# Patient Record
Sex: Female | Born: 1980 | Race: White | Hispanic: No | Smoking: Current every day smoker
Health system: Southern US, Community
[De-identification: ages and names within clinical notes are randomized; demographics above are authoritative.]

## PROBLEM LIST (undated history)

## (undated) DIAGNOSIS — M542 Cervicalgia: Secondary | ICD-10-CM

## (undated) DIAGNOSIS — G8929 Other chronic pain: Secondary | ICD-10-CM

## (undated) HISTORY — PX: TUBAL LIGATION: SHX77

## (undated) HISTORY — PX: CERVICAL SPINE SURGERY: SHX589

---

## 2011-03-31 ENCOUNTER — Emergency Department: Payer: Self-pay | Admitting: Emergency Medicine

## 2014-02-26 ENCOUNTER — Emergency Department (HOSPITAL_BASED_OUTPATIENT_CLINIC_OR_DEPARTMENT_OTHER)
Admission: EM | Admit: 2014-02-26 | Discharge: 2014-02-26 | Disposition: A | Discharge status: Home / Self Care | Payer: Worker's Compensation | Attending: Emergency Medicine | Admitting: Emergency Medicine

## 2014-02-26 ENCOUNTER — Emergency Department (HOSPITAL_BASED_OUTPATIENT_CLINIC_OR_DEPARTMENT_OTHER): Payer: Worker's Compensation

## 2014-02-26 ENCOUNTER — Encounter (HOSPITAL_BASED_OUTPATIENT_CLINIC_OR_DEPARTMENT_OTHER): Payer: Self-pay | Admitting: Emergency Medicine

## 2014-02-26 DIAGNOSIS — M542 Cervicalgia: Secondary | ICD-10-CM | POA: Insufficient documentation

## 2014-02-26 DIAGNOSIS — G8929 Other chronic pain: Secondary | ICD-10-CM

## 2014-02-26 DIAGNOSIS — Z79899 Other long term (current) drug therapy: Secondary | ICD-10-CM | POA: Insufficient documentation

## 2014-02-26 DIAGNOSIS — F17200 Nicotine dependence, unspecified, uncomplicated: Secondary | ICD-10-CM | POA: Insufficient documentation

## 2014-02-26 DIAGNOSIS — Z9889 Other specified postprocedural states: Secondary | ICD-10-CM | POA: Insufficient documentation

## 2014-02-26 DIAGNOSIS — R209 Unspecified disturbances of skin sensation: Secondary | ICD-10-CM | POA: Insufficient documentation

## 2014-02-26 DIAGNOSIS — G8928 Other chronic postprocedural pain: Secondary | ICD-10-CM | POA: Insufficient documentation

## 2014-02-26 MED ORDER — OXYCODONE-ACETAMINOPHEN 5-325 MG PO TABS
2.0000 | ORAL_TABLET | Freq: Once | ORAL | Status: AC
  Administered 2014-02-26: 2 via ORAL
  Filled 2014-02-26: qty 2

## 2014-02-26 MED ORDER — OXYCODONE-ACETAMINOPHEN 5-325 MG PO TABS: 1.0000 | ORAL_TABLET | ORAL | Status: DC | PRN

## 2014-02-26 NOTE — ED Notes (Signed)
Pt reports having C5-C6 repaired in December but 4 days ago started experiencing extreme neck pain.  Pt reports not being able to sleep.  Pt reports it hurts to chew food.  Denies injury and has not broke her 20lb weight limit restriction.

## 2014-02-26 NOTE — ED Provider Notes (Signed)
This chart was scribed for [name], DO by Dorothey Basemania Sutton, ED Scribe. This patient was seen in room MH10/MH10 and the patient's care was started at 8:52 PM.  CHIEF COMPLAINT: neck pain  HPI:  HPI Comments: Brittany Valdez is a 33 y.o. 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 Performed on 12/20/2013 by Dr. [place] BalesLacin in [place], [place] who presents to the Emergency Department complaining of a constant, severe pain to the posterior neck that she states is chronic in nature secondary to her surgery, but has been progressively worsening over the past 4 days. Patient reports some associated numbness and paresthesias to the bilateral arms, which she states has also been ongoing since her surgery. She denies any potential injury or trauma to the area. Patient states that she attempted to follow up with Dr. Jones BalesLacin, but that he reportedly told her that he would not be able to manage her pain. Patient states that she last received an MRI on 01/11/2014, which was normal. She denies bowel or bladder incontinence. No new weakness or numbness.  Patient states that she does not currently have a PCP. She denies any allergies to medications. Patient has no other pertinent medical history.   ROS: See HPI Constitutional: no fever  Eyes: no drainage  ENT: no runny nose   Cardiovascular:  no chest pain  Resp: no SOB  GI: no vomiting GU: no dysuria Integumentary: no rash  Allergy: no hives  Musculoskeletal: neck pain, no leg swelling  Neurological: numbness, paresthesias, no slurred speech, no incontinence ROS otherwise negative  PAST MEDICAL HISTORY/PAST SURGICAL HISTORY:  History reviewed. No pertinent past medical history.  MEDICATIONS:  Prior to Admission medications   Medication Sig Start Date End Date Taking? Authorizing Provider  Gabapentin (NEURONTIN PO)  Take by mouth.   Yes Historical Provider, MD  Ketorolac Tromethamine (TORADOL ORAL PO) Take by mouth.   Yes Historical Provider, MD  lidocaine (LIDODERM) 5 % Place 1 patch onto the skin daily. Remove & Discard patch within 12 hours or as directed by MD   Yes Historical Provider, MD  metaxalone (SKELAXIN) 800 MG tablet Take 800 mg by mouth 3 (three) times daily.   Yes Historical Provider, MD    ALLERGIES:  No Known Allergies  SOCIAL HISTORY:  History  Substance Use Topics  . Smoking status: Current Every Day Smoker -- 0.50 packs/day    Types: Cigarettes  . Smokeless tobacco: Not on file  . Alcohol Use: No    FAMILY HISTORY: No family history on file.  EXAM: Triage Vitals: BP 145/100  Pulse 96  Temp(Src) 98.2 F (36.8 C) (Oral)  Resp 20  Ht 5\' 7"  (1.702 m)  Wt 223 lb (101.152 kg)  BMI 34.92 kg/m2  SpO2 100%  LMP 02/14/2014  CONSTITUTIONAL: Alert and oriented and responds appropriately to questions. Well-appearing; well-nourished; patient is tearful and appears uncomfortable HEAD: Normocephalic EYES: Conjunctivae clear, PERRL ENT: normal nose; no rhinorrhea; moist mucous membranes; pharynx without lesions noted NECK: Supple, no meningismus, no LAD; some midline tenderness to palpation of the C spine  CARD: RRR; S1 and S2 appreciated; no murmurs, no clicks, no rubs, no gallops RESP: Normal chest excursion without splinting or tachypnea; breath sounds clear and equal bilaterally; no wheezes, no rhonchi, no rales,  ABD/GI: Normal bowel sounds; non-distended; soft, non-tender, no rebound, no guarding BACK:  The back appears  normal and is non-tender to palpation, there is no CVA tenderness EXT: Normal ROM in all joints; non-tender to palpation; no edema; normal capillary refill; no cyanosis    SKIN: Normal color for age and race; warm NEURO: Moves all extremities equally; 5/5 strength and normal sensation to light touch of bilateral upper and lower extremities; 2+ DTRs of  bilateral upper and lower extremities PSYCH: The patient's mood and manner are appropriate. Grooming and personal hygiene are appropriate.  MEDICAL DECISION MAKING: Patient presents with chronic neck pain after a C5-C6 surgery in December, 2014. Patient last had an MRI on 01/11/2014 and is not concerning for an emergent MRI at this time. Patient has no new neurologic deficits. She is nontoxic appearing. No infectious symptoms. I am not concerned for discitis, transverse myelitis, epidural abscess, spinal stenosis or other life-threatening injury or illness. Wll order a CT of the C spine due to midline tenderness upon examination. Advised patient to find a PCP to have an additional MRI if clinically appropriate at that time. Will order Percocet.  10:05 PM- Independently reviewed CT results, which were unchanged from prior examinations and negative for acute abnormality. Will provide patient with resources to help her find a PCP, neurologist, and pain management. Will discharge patient with pain medication. Return precautions given.   ED PROGRESS:  9:00 PM- Will order a CT of the C spine and Percocet. Discussed treatment plan with patient at bedside and patient verbalized agreement.   10:04 PM- Patient reports feeling better at recheck after receiving the Percocet. Discussed that CT results were normal. Will discharge patient with pain medication to manage symptoms. Discussed treatment plan with patient at bedside and patient verbalized agreement.    Ct Cervical Spine Wo Contrast  02/26/2014   CLINICAL DATA:  Neck pain. Bilateral arm numbness. C5-6 anterior discectomy on 12/19/2013  EXAM: CT CERVICAL SPINE WITHOUT CONTRAST  TECHNIQUE: Multidetector CT imaging of the cervical spine was performed without intravenous contrast. Multiplanar CT image reconstructions were also generated.  COMPARISON:  CT scan dated 02/08/2014  FINDINGS: The patient has undergone anterior cervical fusion at C5-6. The anterior  plate, screws, and interbody bone plug appear in excellent position. There is no prevertebral soft tissue swelling. There is no protrusion of material into the spinal canal at the operative level. There is no foraminal stenosis or spinal stenosis.  The remainder of the cervical spine demonstrates no acute abnormalities. There is degenerative disc disease at C6-7 with disc space narrowing and slight osteophyte formation with a small broad-based disc bulge with no neural impingement, unchanged.  IMPRESSION: No acute abnormalities.  No change since the prior exam.   Electronically Signed   By: Geanie Cooley M.D.   On: 02/26/2014 21:44     I personally performed the services described in this documentation, which was scribed in my presence. The recorded information has been reviewed and is accurate.   Layla Maw Makaiah Terwilliger, DO 02/26/14 2229

## 2014-02-26 NOTE — Discharge Instructions (Signed)
Chronic Back Pain ° When back pain lasts longer than 3 months, it is called chronic back pain. People with chronic back pain often go through certain periods that are more intense (flare-ups).  °CAUSES °Chronic back pain can be caused by wear and tear (degeneration) on different structures in your back. These structures include: °· The bones of your spine (vertebrae) and the joints surrounding your spinal cord and nerve roots (facets). °· The strong, fibrous tissues that connect your vertebrae (ligaments). °Degeneration of these structures may result in pressure on your nerves. This can lead to constant pain. °HOME CARE INSTRUCTIONS °· Avoid bending, heavy lifting, prolonged sitting, and activities which make the problem worse. °· Take brief periods of rest throughout the day to reduce your pain. Lying down or standing usually is better than sitting while you are resting. °· Take over-the-counter or prescription medicines only as directed by your caregiver. °SEEK IMMEDIATE MEDICAL CARE IF:  °· You have weakness or numbness in one of your legs or feet. °· You have trouble controlling your bladder or bowels. °· You have nausea, vomiting, abdominal pain, shortness of breath, or fainting. °Document Released: 01/14/2005 Document Revised: 02/29/2012 Document Reviewed: 11/21/2011 °ExitCare® Patient Information ©2014 [employer]. ° ° °Emergency Department Resource Guide °1) Find a Doctor and Pay Out of Pocket °Although you won't have to find out who is covered by your insurance plan, it is a good idea to ask around and get recommendations. You will then need to call the office and see if the doctor you have chosen will accept you as a new patient and what types of options they offer for patients who are self-pay. Some doctors offer discounts or will set up payment plans for their patients who do not have insurance, but you will need to ask so you aren't surprised when you get to your appointment. ° °2) Contact Your Local  Health Department °Not all health departments have doctors that can see patients for sick visits, but many do, so it is worth a call to see if yours does. If you don't know where your local health department is, you can check in your phone book. The CDC also has a tool to help you locate your state's health department, and many state websites also have listings of all of their local health departments. ° °3) Find a Walk-in Clinic °If your illness is not likely to be very severe or complicated, you may want to try a walk in clinic. These are popping up all over the country in pharmacies, drugstores, and shopping centers. They're usually staffed by nurse practitioners or physician assistants that have been trained to treat common illnesses and complaints. They're usually fairly quick and inexpensive. However, if you have serious medical issues or chronic medical problems, these are probably not your best option. ° °No Primary Care Doctor: °- Call [facility] at  832-8000 - they can help you locate a primary care doctor that  accepts your insurance, provides certain services, etc. °- Physician Referral Service- 1-800-533-3463 ° °Chronic Pain Problems: °Organization         Address  Phone   Notes  °[facility]  (336) 297-2271 Patients need to be referred by their primary care doctor.  ° °Medication Assistance: °Organization         Address  Phone   Notes  °[place] [facility] [address] °[place], [place] [address] (336) 641-8030 --Must be a resident of [place] °--   Must have NO insurance coverage whatsoever (no Medicaid/ Medicare, etc.) °-- The pt. MUST have a primary care doctor that directs their care regularly and follows them in the community °  °[facility]  (866) 331-1348   °[employer]  (888) 892-1162   ° °Agencies that provide inexpensive medical care: °Organization         Address  Phone   Notes  °[facility]  (336) 832-8035    °[facility]    (336) 832-7272   °[facility] [address] °[place], [place] [address] (336) 832-4777   °[facility] [address], °[place] (336) 271-4999   °[facility]    (336) 373-0678   °[facility]    (336) 272-1050   °[facility] ° [address], [place] Phone:  (336) 832-4444, Fax:  (336) 832-4440 Hours of Operation:  9 am - 6 pm, M-F.  Also accepts Medicaid/Medicare and self-pay.  °[facility] ° [address], [place] Phone: (336) 832-3150, Fax: (336) 832-3151. Hours of Operation:  8:30 am - 5:30 pm, M-F.  Also accepts Medicaid and self-pay.  °[facility] [place] [address], [place] Phone: (336) 878-6027   °[facility] [address], [place], [place] (336)723-1848, Ext. 123 Mondays & Thursdays: 7-9 AM.  First 15 patients are seen on a first come, first serve basis. °  ° °Medicaid-accepting [place] Providers: ° °Organization         Address  Phone   Notes  °[facility] [address], [place] (336) 641-2100 Also accepts self-pay patients.  °[facility] [address], [place] ° (336) 856-9996   °[facility] [address], [place] (336) 288-8857   °[facility] [address] [place] [address], [place] (336) 299-7000   °Veita Bland [address], [place]  ° (336) 373-1557 Only accepts [place] Access Medicaid patients after they have their name applied to their card.  ° °Self-Pay (no insurance) in [place]: ° °Organization         Address  Phone   Notes  °Sickle Cell Patients, [facility] [address], [place] (336) 832-1970   °[facility] [address], [place] (336) 832-4400   °[facility] ° [address],  [place] (336) 992-4800   °[facility]/Dr. Osei-Bonsu ° [address] [place] [address], [place] or [address], [place] (336) [phone] Phone number for both [place] and [place] locations is the same.  °[facility] [address], [place] (336) 299-0000   °[facility] [address] [place] [address], [place] or [address] (336) 852-7530 °(336) 878-2260   °[facility] [address],  (336) 350-1642, phone; (336) 294-5005, fax Sees patients 1st and 3rd Saturday of every month.  Must not qualify for public or private insurance (i.e. Medicaid, Medicare, [place] Health Choice, Veterans' Benefits) • Household income should be no more than 200% of the poverty level •The clinic cannot treat you if you are pregnant or think you are pregnant • Sexually transmitted diseases are not treated at the clinic.  ° ° °Dental Care: °Organization         Address  Phone  Notes  °  [place] [facility] [address], [place] (336) 641-6152 Accepts children up to age 21 who are enrolled in Medicaid or [place] Health Choice; pregnant women with a Medicaid card; and children who have applied for Medicaid or [place] Health Choice, but were declined, whose parents can pay a reduced fee at time of service.  °[facility] [place]  [address], [place] (336) 641-7733 Accepts children up to age 21 who are enrolled in Medicaid or [place] Health Choice; pregnant women with a Medicaid card; and children who have applied for Medicaid or [place] Health Choice, but were declined, whose parents can pay a reduced fee at time of service.  °[facility] ° [address], [place] (336) 641-4533 Patients are seen by appointment only. Walk-ins are not accepted. [facility] will see patients 18 years of age and older. °Monday - Tuesday (8am-5pm) °Most Wednesdays  (8:30-5pm) °$30 per visit, cash only  °[facility] ° [address], [place] (336) 641-4533 Patients are seen by appointment only. Walk-ins are not accepted. [facility] will see patients 18 years of age and older. °One Wednesday Evening (Monthly: Volunteer Based).  $30 per visit, cash only  °[facility]  (919) 537-3737 for adults; Children under age 4, call Graduate Pediatric Dentistry at ([phone]) [phone]. Children aged 4-14, please call (919) [phone] to request a pediatric application. ° Dental services are provided in all areas of dental care including fillings, crowns and bridges, complete and partial dentures, implants, gum treatment, root canals, and extractions. Preventive care is also provided. Treatment is provided to both adults and children. °Patients are selected via a lottery and there is often a waiting list. °  °[facility] [address], °[place] ° (336) 763-8833 [email or web address] °  °[facility] [address], [place], [place] (336)723-1848, Ext. 123 Second and Fourth Thursday of each month, opens at 6:30 AM; Clinic ends at 9 AM.  Patients are seen on a first-come first-served basis, and a limited number are seen during each clinic.  ° °[facility] ° [address], [place], [place] (336) 723-7904   Eligibility Requirements °You must have lived in [place], [place], or [place] counties for at least the last three months. °  You cannot be eligible for state or federal sponsored healthcare insurance, including Veterans Administration, Medicaid, or Medicare. °  You generally cannot be eligible for healthcare insurance through your employer.  °  How to apply: °Eligibility screenings are held every Tuesday and Wednesday afternoon from 1:00 pm until 4:00 pm. You do not need an appointment for the interview!  °[facility] [address], [place], [place] 336-631-2330   °[place]  [facility]  336-342-8273   °[facility]  336-703-3100   °[facility]  336-570-6415   ° °Behavioral Health Resources in the Community: °Intensive Outpatient Programs °Organization         Address  Phone  Notes  °[facility] [address], [place], [place] 336-878-6098   °[facility] [address], [place], [place] 336-832-9800   °[facility] [address], , [place] ° 336-882-2125   °[facility] [address],  °, [place] 1-800-853-5163 or 336-641-4981   °Substance Abuse Resources °Organization           Address  Phone  Notes  °[facility]  336-882-2125   °[facility]  336-784-9470   °[facility]  336-285-9073   °[facility]  336-845-3988   °Residential & Outpatient Substance Abuse Program  1-800-659-3381   °Psychological Services °Organization         Address  Phone  Notes  °[facility]  336- 832-9600   °[facility]  336- 378-7881   °[facility] [address], [place] 1-800-853-5163 or 336-641-4981   ° °Mobile Crisis Teams °Organization         Address  Phone  Notes  °[facility], Mobile Crisis Care Unit  1-877-626-1772   °Assertive °[facility] ° [address] [place], [place] 336-834-9664   °Sharon DeEsch [address] °[place] 336-554-5454   ° °Self-Help/Support Groups °Organization         Address  Phone             Notes  °[facility] [place] [facility] - variety of support groups  336- 373-1402 Call for more information  °Narcotics Anonymous (NA), [facility] [address], °[place]  2 meetings at this location  ° °Residential Treatment Programs °Organization         Address  Phone  Notes  °[facility] [address],    °[place]  1-866-801-8205   °[facility] ° [address], [place], [place]  704-293-8524   °[facility] [address], [place] 336-845-3988 Admissions: 8am-3pm M-F  °[facility] [address]    °[place], [place] 336-841-1104   °[facility] [address], [place], [place] 336-379-7146   °[facility] [address]  °, [place] 336-285-9073   °[facility] - Intensive Outpatient [address], , [place] 336-852-3033   °[facility] [address]  °[place], [place] 1-877-615-2722 or 336-784-9470   °[facility] [address] [place], [place] 336-227-7417 Accepts Medicaid  °[facility] [address]  ° [place] 1-800-659-3381 Substance Abuse/Addiction Treatment  ° °[place] Behavioral Health Resources °Organization         Address  Phone  Notes  °[facility]  (888) 581-9988   °Julie Brannon, PhD [address] [place], [place]   (336) 349-5553 or (336) 951-0000   °[facility]   [address] °[place], [place] (336) 349-4454   °[facility] [address], [place], [place] (336) 342-8316 Insurance/Medicaid/sponsorship through [facility]  °[facility] [address]                                    [place], [place] (336) 342-8316 Therapy/tele-psych/case  °[facility] [address]  ° [place], [place] (336) 349-2233    °Dr. Arfeen  (336) 349-4544   °[facility] [place]  [employer] [place] [facility] 1) [address], [place] °2) [address], [place] °3)  [address], [place] (336) 349-3220 °(336) 342-7768 ° °(336) 342-8140   °[place] [facility] (336) 342-1394 or (336) 342-3537 (After Hours)    ° ° ° °

## 2014-03-23 ENCOUNTER — Emergency Department (HOSPITAL_COMMUNITY): Payer: Worker's Compensation

## 2014-03-23 ENCOUNTER — Emergency Department (HOSPITAL_COMMUNITY)
Admission: EM | Admit: 2014-03-23 | Discharge: 2014-03-23 | Disposition: A | Discharge status: Home / Self Care | Payer: Worker's Compensation | Attending: Emergency Medicine | Admitting: Emergency Medicine

## 2014-03-23 ENCOUNTER — Encounter (HOSPITAL_COMMUNITY): Payer: Self-pay | Admitting: Emergency Medicine

## 2014-03-23 DIAGNOSIS — IMO0002 Reserved for concepts with insufficient information to code with codable children: Secondary | ICD-10-CM | POA: Insufficient documentation

## 2014-03-23 DIAGNOSIS — F411 Generalized anxiety disorder: Secondary | ICD-10-CM | POA: Insufficient documentation

## 2014-03-23 DIAGNOSIS — Z79899 Other long term (current) drug therapy: Secondary | ICD-10-CM | POA: Insufficient documentation

## 2014-03-23 DIAGNOSIS — Y9389 Activity, other specified: Secondary | ICD-10-CM | POA: Insufficient documentation

## 2014-03-23 DIAGNOSIS — Y9210 Unspecified residential institution as the place of occurrence of the external cause: Secondary | ICD-10-CM | POA: Insufficient documentation

## 2014-03-23 DIAGNOSIS — R42 Dizziness and giddiness: Secondary | ICD-10-CM | POA: Insufficient documentation

## 2014-03-23 DIAGNOSIS — X500XXA Overexertion from strenuous movement or load, initial encounter: Secondary | ICD-10-CM | POA: Insufficient documentation

## 2014-03-23 DIAGNOSIS — R55 Syncope and collapse: Secondary | ICD-10-CM | POA: Insufficient documentation

## 2014-03-23 DIAGNOSIS — Z9889 Other specified postprocedural states: Secondary | ICD-10-CM | POA: Insufficient documentation

## 2014-03-23 DIAGNOSIS — S161XXA Strain of muscle, fascia and tendon at neck level, initial encounter: Secondary | ICD-10-CM

## 2014-03-23 DIAGNOSIS — F17200 Nicotine dependence, unspecified, uncomplicated: Secondary | ICD-10-CM | POA: Insufficient documentation

## 2014-03-23 DIAGNOSIS — S139XXA Sprain of joints and ligaments of unspecified parts of neck, initial encounter: Secondary | ICD-10-CM | POA: Insufficient documentation

## 2014-03-23 LAB — CBC WITH DIFFERENTIAL/PLATELET
BASOS ABS: 0 10*3/uL (ref 0.0–0.1)
BASOS PCT: 0 % (ref 0–1)
EOS ABS: 0 10*3/uL (ref 0.0–0.7)
Eosinophils Relative: 0 % (ref 0–5)
HCT: 39.8 % (ref 36.0–46.0)
Hemoglobin: 13.6 g/dL (ref 12.0–15.0)
Lymphocytes Relative: 46 % (ref 12–46)
Lymphs Abs: 4.7 10*3/uL — ABNORMAL HIGH (ref 0.7–4.0)
MCH: 31 pg (ref 26.0–34.0)
MCHC: 34.2 g/dL (ref 30.0–36.0)
MCV: 90.7 fL (ref 78.0–100.0)
Monocytes Absolute: 0.6 10*3/uL (ref 0.1–1.0)
Monocytes Relative: 5 % (ref 3–12)
NEUTROS PCT: 48 % (ref 43–77)
Neutro Abs: 5 10*3/uL (ref 1.7–7.7)
PLATELETS: 333 10*3/uL (ref 150–400)
RBC: 4.39 MIL/uL (ref 3.87–5.11)
RDW: 13.3 % (ref 11.5–15.5)
WBC: 10.3 10*3/uL (ref 4.0–10.5)

## 2014-03-23 LAB — BASIC METABOLIC PANEL
BUN: 12 mg/dL (ref 6–23)
CALCIUM: 9.7 mg/dL (ref 8.4–10.5)
CO2: 25 mEq/L (ref 19–32)
Chloride: 103 mEq/L (ref 96–112)
Creatinine, Ser: 0.78 mg/dL (ref 0.50–1.10)
GFR calc Af Amer: >90 mL/min (ref >90)
GFR calc non Af Amer: >90 mL/min (ref >90)
GLUCOSE: 99 mg/dL (ref 70–99)
Potassium: 3.5 mEq/L — ABNORMAL LOW (ref 3.7–5.3)
SODIUM: 140 meq/L (ref 137–147)

## 2014-03-23 MED ORDER — IOHEXOL 350 MG/ML SOLN
100.0000 mL | Freq: Once | INTRAVENOUS | Status: AC | PRN
  Administered 2014-03-23: 100 mL via INTRAVENOUS

## 2014-03-23 MED ORDER — ONDANSETRON HCL 4 MG/2ML IJ SOLN
4.0000 mg | Freq: Once | INTRAMUSCULAR | Status: AC
  Administered 2014-03-23: 4 mg via INTRAVENOUS
  Filled 2014-03-23: qty 2

## 2014-03-23 MED ORDER — HYDROMORPHONE HCL PF 1 MG/ML IJ SOLN
1.0000 mg | Freq: Once | INTRAMUSCULAR | Status: AC
  Administered 2014-03-23: 1 mg via INTRAVENOUS
  Filled 2014-03-23: qty 1

## 2014-03-23 MED ORDER — OXYCODONE-ACETAMINOPHEN 5-325 MG PO TABS: 1.0000 | ORAL_TABLET | ORAL | Status: DC | PRN

## 2014-03-23 NOTE — ED Provider Notes (Signed)
CSN: [id number]     Arrival date & time 03/23/14  1354 History   First MD Initiated Contact with Patient 03/23/14 1410     Chief Complaint  Patient presents with  . Neck Pain     (Consider location/radiation/quality/duration/timing/severity/associated sxs/prior Treatment) HPI  This is a 33 y.o. 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 Performed on 12/20/2013 by Dr. Jones BalesLacin in [place], [place] who presents to the Emergency Department with neck pain and syncope. Patient reports that she was in physical therapy on Wednesday when she was doing some new flexion exercises. She flexes her neck forward and had onset of dizziness and presyncopal symptoms. She had to sit down. Patient states that she tried to get in to see her neurosurgeon yesterday but was unable to make the appointment. She reports a syncopal episode yesterday.  She denies any chest pain or shortness of breath. She reports 9/10 pain which is not improved with tramadol.   History reviewed. No pertinent past medical history. Past Surgical History  Procedure Laterality Date  . Cervical spine surgery    . Tubal ligation     History reviewed. No pertinent family history. History  Substance Use Topics  . Smoking status: Current Every Day Smoker -- 0.50 packs/day    Types: Cigarettes  . Smokeless tobacco: Not on file  . Alcohol Use: No   OB History   Grav Para Term Preterm Abortions TAB SAB Ect Mult Living                 Review of Systems  Constitutional: Negative for fever.  Respiratory: Negative for cough, chest tightness and shortness of breath.   Cardiovascular: Negative for chest pain.  Gastrointestinal: Negative for nausea, vomiting and abdominal pain.  Genitourinary: Negative for dysuria.  Musculoskeletal: Positive for neck pain. Negative for back pain.  Skin: Negative  for wound.  Neurological: Positive for dizziness, syncope and light-headedness. Negative for weakness, numbness and headaches.  Psychiatric/Behavioral: Negative for confusion.  All other systems reviewed and are negative.      Allergies  Review of patient's allergies indicates no known allergies.  Home Medications   Current Outpatient Rx  Name  Route  Sig  Dispense  Refill  . gabapentin (NEURONTIN) 300 MG capsule   Oral   Take 300 mg by mouth 3 (three) times daily.         Marland Kitchen. ibuprofen (ADVIL,MOTRIN) 200 MG tablet   Oral   Take 800 mg by mouth every 6 (six) hours as needed for moderate pain.         Marland Kitchen. ketorolac (TORADOL) 10 MG tablet   Oral   Take 10 mg by mouth every 6 (six) hours as needed for moderate pain.         Marland Kitchen. lidocaine (LIDODERM) 5 %   Transdermal   Place 1 patch onto the skin every 12 (twelve) hours. Remove & Discard patch within 12 hours or as directed by MD         . metaxalone (SKELAXIN) 800 MG tablet   Oral   Take 800 mg by mouth 3 (three) times daily as needed for muscle spasms.          . methylPREDNISolone (MEDROL) 4 MG tablet   Oral   Take 4-24 mg by mouth daily. She took for 6 days and started it on 03/17/14. She  took six tablets on day 1, five tablets on day 2, four tablets on day 3, three tablets on day 4, two tablets on day 5 and one tablet on day 6. She completed the regimen yesterday (03/22/14).         Marland Kitchen RASPBERRY KETONES PO   Oral   Take 2 tablets by mouth every morning.         . traMADol (ULTRAM) 50 MG tablet   Oral   Take 50 mg by mouth every 6 (six) hours as needed for moderate pain.          BP 132/90  Pulse 83  Temp(Src) 97.7 F (36.5 C) (Oral)  Resp 16  SpO2 100%  LMP 03/23/2014 Physical Exam  Nursing note and vitals reviewed. Constitutional: She is oriented to person, place, and time. She appears well-developed and well-nourished.  Anxious appearing  HENT:  Head: Normocephalic and atraumatic.  Mouth/Throat:  Oropharynx is clear and moist.  Eyes: Pupils are equal, round, and reactive to light.  Neck: Neck supple.  Limited range of motion secondary to pain, tenderness to palpation over the mid C-spine, no obvious deformity  Cardiovascular: Normal rate, regular rhythm and normal heart sounds.   No murmur heard. Pulmonary/Chest: Effort normal and breath sounds normal. No respiratory distress. She has no wheezes.  Abdominal: Soft. Bowel sounds are normal. There is no tenderness. There is no rebound.  Neurological: She is alert and oriented to person, place, and time.  Cranial nerves II through XII intact, 5 out of 5 strength in all 4 extremities, no clonus noted, no dysmetria  Skin: Skin is warm and dry.  Psychiatric: She has a normal mood and affect.    ED Course  Procedures (including critical care time) Labs Review Labs Reviewed  BASIC METABOLIC PANEL - Abnormal; Notable for the following:    Potassium 3.5 (*)    All other components within normal limits  CBC WITH DIFFERENTIAL - Abnormal; Notable for the following:    Lymphs Abs 4.7 (*)    All other components within normal limits   Imaging Review No results found.   EKG Interpretation   Date/Time:  Friday March 23 2014 14:50:55 EDT Ventricular Rate:  77 PR Interval:  130 QRS Duration: 108 QT Interval:  377 QTC Calculation: 427 R Axis:   64 Text Interpretation:  Sinus rhythm Low voltage, precordial leads Baseline  wander in lead(s) II V1 Confirmed by HORTON  MD, COURTNEY ([id number]) on  03/23/2014 3:16:06 PM      MDM   Final diagnoses:  None   Patient with cervical spine pain and syncope.  NOntoxic and nonfocal.  EKG and orthostatics within normal limits.  GIven recent neck manipulation and subsequent syncope, concern for dissection.  CTA pending.  Patient given pain medications.  Signed out to Dr. Micheline Maze.   Shon Baton, MD 03/23/14 [phone]

## 2014-03-23 NOTE — Discharge Instructions (Signed)
Cervical Sprain A cervical sprain is an injury in the neck in which the strong, fibrous tissues (ligaments) that connect your neck bones stretch or tear. Cervical sprains can range from mild to severe. Severe cervical sprains can cause the neck vertebrae to be unstable. This can lead to damage of the spinal cord and can result in serious nervous system problems. The amount of time it takes for a cervical sprain to get better depends on the cause and extent of the injury. Most cervical sprains heal in 1 to 3 weeks. CAUSES  Severe cervical sprains may be caused by:   Contact sport injuries (such as from football, rugby, wrestling, hockey, auto racing, gymnastics, diving, martial arts, or boxing).   Motor vehicle collisions.   Whiplash injuries. This is an injury from a sudden forward-and backward whipping movement of the head and neck.  Falls.  Mild cervical sprains may be caused by:   Being in an awkward position, such as while cradling a telephone between your ear and shoulder.   Sitting in a chair that does not offer proper support.   Working at a poorly designed computer station.   Looking up or down for long periods of time.  SYMPTOMS   Pain, soreness, stiffness, or a burning sensation in the front, back, or sides of the neck. This discomfort may develop immediately after the injury or slowly, 24 hours or more after the injury.   Pain or tenderness directly in the middle of the back of the neck.   Shoulder or upper back pain.   Limited ability to move the neck.   Headache.   Dizziness.   Weakness, numbness, or tingling in the hands or arms.   Muscle spasms.   Difficulty swallowing or chewing.   Tenderness and swelling of the neck.  DIAGNOSIS  Most of the time your health care provider can diagnose a cervical sprain by taking your history and doing a physical exam. Your health care provider will ask about previous neck injuries and any known neck  problems, such as arthritis in the neck. X-rays may be taken to find out if there are any other problems, such as with the bones of the neck. Other tests, such as a CT scan or MRI, may also be needed.  TREATMENT  Treatment depends on the severity of the cervical sprain. Mild sprains can be treated with rest, keeping the neck in place (immobilization), and pain medicines. Severe cervical sprains are immediately immobilized. Further treatment is done to help with pain, muscle spasms, and other symptoms and may include:  Medicines, such as pain relievers, numbing medicines, or muscle relaxants.   Physical therapy. This may involve stretching exercises, strengthening exercises, and posture training. Exercises and improved posture can help stabilize the neck, strengthen muscles, and help stop symptoms from returning.  HOME CARE INSTRUCTIONS   Put ice on the injured area.   Put ice in a plastic bag.   Place a towel between your skin and the bag.   Leave the ice on for 15 20 minutes, 3 4 times a day.   If your injury was severe, you may have been given a cervical collar to wear. A cervical collar is a two-piece collar designed to keep your neck from moving while it heals.  Do not remove the collar unless instructed by your health care provider.  If you have long hair, keep it outside of the collar.  Ask your health care provider before making any adjustments to your collar.   Minor adjustments may be required over time to improve comfort and reduce pressure on your chin or on the back of your head.  Ifyou are allowed to remove the collar for cleaning or bathing, follow your health care provider's instructions on how to do so safely.  Keep your collar clean by wiping it with mild soap and water and drying it completely. If the collar you have been given includes removable pads, remove them every 1 2 days and hand wash them with soap and water. Allow them to air dry. They should be completely  dry before you wear them in the collar.  If you are allowed to remove the collar for cleaning and bathing, wash and dry the skin of your neck. Check your skin for irritation or sores. If you see any, tell your health care provider.  Do not drive while wearing the collar.   Only take over-the-counter or prescription medicines for pain, discomfort, or fever as directed by your health care provider.   Keep all follow-up appointments as directed by your health care provider.   Keep all physical therapy appointments as directed by your health care provider.   Make any needed adjustments to your workstation to promote good posture.   Avoid positions and activities that make your symptoms worse.   Warm up and stretch before being active to help prevent problems.  SEEK MEDICAL CARE IF:   Your pain is not controlled with medicine.   You are unable to decrease your pain medicine over time as planned.   Your activity level is not improving as expected.  SEEK IMMEDIATE MEDICAL CARE IF:   You develop any bleeding.  You develop stomach upset.  You have signs of an allergic reaction to your medicine.   Your symptoms get worse.   You develop new, unexplained symptoms.   You have numbness, tingling, weakness, or paralysis in any part of your body.  MAKE SURE YOU:   Understand these instructions.  Will watch your condition.  Will get help right away if you are not doing well or get worse. Document Released: 10/04/2007 Document Revised: 09/27/2013 Document Reviewed: 06/14/2013 [employer] Patient Information 2014 [employer]  

## 2014-05-30 ENCOUNTER — Encounter (HOSPITAL_BASED_OUTPATIENT_CLINIC_OR_DEPARTMENT_OTHER): Payer: Self-pay | Admitting: Emergency Medicine

## 2014-05-30 ENCOUNTER — Emergency Department (HOSPITAL_BASED_OUTPATIENT_CLINIC_OR_DEPARTMENT_OTHER)
Admission: EM | Admit: 2014-05-30 | Discharge: 2014-05-30 | Disposition: A | Discharge status: Home / Self Care | Payer: Worker's Compensation | Attending: Emergency Medicine | Admitting: Emergency Medicine

## 2014-05-30 DIAGNOSIS — R5381 Other malaise: Secondary | ICD-10-CM | POA: Insufficient documentation

## 2014-05-30 DIAGNOSIS — Z9889 Other specified postprocedural states: Secondary | ICD-10-CM | POA: Insufficient documentation

## 2014-05-30 DIAGNOSIS — R209 Unspecified disturbances of skin sensation: Secondary | ICD-10-CM | POA: Insufficient documentation

## 2014-05-30 DIAGNOSIS — F17200 Nicotine dependence, unspecified, uncomplicated: Secondary | ICD-10-CM | POA: Insufficient documentation

## 2014-05-30 DIAGNOSIS — IMO0002 Reserved for concepts with insufficient information to code with codable children: Secondary | ICD-10-CM | POA: Insufficient documentation

## 2014-05-30 DIAGNOSIS — Z79899 Other long term (current) drug therapy: Secondary | ICD-10-CM | POA: Insufficient documentation

## 2014-05-30 DIAGNOSIS — M542 Cervicalgia: Secondary | ICD-10-CM

## 2014-05-30 DIAGNOSIS — R5383 Other fatigue: Secondary | ICD-10-CM

## 2014-05-30 MED ORDER — DIAZEPAM 5 MG PO TABS
5.0000 mg | ORAL_TABLET | Freq: Once | ORAL | Status: AC
  Administered 2014-05-30: 5 mg via ORAL
  Filled 2014-05-30: qty 1

## 2014-05-30 MED ORDER — OXYCODONE-ACETAMINOPHEN 5-325 MG PO TABS: 1.0000 | ORAL_TABLET | Freq: Four times a day (QID) | ORAL | Status: DC | PRN

## 2014-05-30 MED ORDER — HYDROMORPHONE HCL PF 1 MG/ML IJ SOLN
1.0000 mg | Freq: Once | INTRAMUSCULAR | Status: AC
  Administered 2014-05-30: 1 mg via INTRAMUSCULAR
  Filled 2014-05-30: qty 1

## 2014-05-30 NOTE — ED Notes (Signed)
Pt c/o neck/shoulder and arm pain x 3 days

## 2014-05-30 NOTE — Discharge Instructions (Signed)
Musculoskeletal Pain °Musculoskeletal pain is muscle and boney aches and pains. These pains can occur in any part of the body. Your caregiver may treat you without knowing the cause of the pain. They may treat you if blood or urine tests, X-rays, and other tests were normal.  °CAUSES °There is often not a definite cause or reason for these pains. These pains may be caused by a type of germ (virus). The discomfort may also come from overuse. Overuse includes working out too hard when your body is not fit. Boney aches also come from weather changes. Bone is sensitive to atmospheric pressure changes. °HOME CARE INSTRUCTIONS  °· Ask when your test results will be ready. Make sure you get your test results. °· Only take over-the-counter or prescription medicines for pain, discomfort, or fever as directed by your caregiver. If you were given medications for your condition, do not drive, operate machinery or power tools, or sign legal documents for 24 hours. Do not drink alcohol. Do not take sleeping pills or other medications that may interfere with treatment. °· Continue all activities unless the activities cause more pain. When the pain lessens, slowly resume normal activities. Gradually increase the intensity and duration of the activities or exercise. °· During periods of severe pain, bed rest may be helpful. Lay or sit in any position that is comfortable. °· Putting ice on the injured area. °· Put ice in a bag. °· Place a towel between your skin and the bag. °· Leave the ice on for 15 to 20 minutes, 3 to 4 times a day. °· Follow up with your caregiver for continued problems and no reason can be found for the pain. If the pain becomes worse or does not go away, it may be necessary to repeat tests or do additional testing. Your caregiver may need to look further for a possible cause. °SEEK IMMEDIATE MEDICAL CARE IF: °· You have pain that is getting worse and is not relieved by medications. °· You develop chest pain  that is associated with shortness or breath, sweating, feeling sick to your stomach (nauseous), or throw up (vomit). °· Your pain becomes localized to the abdomen. °· You develop any new symptoms that seem different or that concern you. °MAKE SURE YOU:  °· Understand these instructions. °· Will watch your condition. °· Will get help right away if you are not doing well or get worse. °Document Released: 12/07/2005 Document Revised: 02/29/2012 Document Reviewed: 08/11/2013 °ExitCare® Patient Information ©2014 [employer]. ° ° °Emergency Department Resource Guide °1) Find a Doctor and Pay Out of Pocket °Although you won't have to find out who is covered by your insurance plan, it is a good idea to ask around and get recommendations. You will then need to call the office and see if the doctor you have chosen will accept you as a new patient and what types of options they offer for patients who are self-pay. Some doctors offer discounts or will set up payment plans for their patients who do not have insurance, but you will need to ask so you aren't surprised when you get to your appointment. ° °2) Contact Your Local Health Department °Not all health departments have doctors that can see patients for sick visits, but many do, so it is worth a call to see if yours does. If you don't know where your local health department is, you can check in your phone book. The CDC also has a tool to help you locate your state's   health department, and many state websites also have listings of all of their local health departments. ° °3) Find a Walk-in Clinic °If your illness is not likely to be very severe or complicated, you may want to try a walk in clinic. These are popping up all over the country in pharmacies, drugstores, and shopping centers. They're usually staffed by nurse practitioners or physician assistants that have been trained to treat common illnesses and complaints. They're usually fairly quick and inexpensive. However,  if you have serious medical issues or chronic medical problems, these are probably not your best option. ° °No Primary Care Doctor: °- Call [facility] at  832-8000 - they can help you locate a primary care doctor that  accepts your insurance, provides certain services, etc. °- Physician Referral Service- 1-800-533-3463 ° °Chronic Pain Problems: °Organization         Address  Phone   Notes  °[facility]  (336) 297-2271 Patients need to be referred by their primary care doctor.  ° °Medication Assistance: °Organization         Address  Phone   Notes  °[place] [facility] [address] °[place], [place] [address] (336) 641-8030 --Must be a resident of [place] °-- Must have NO insurance coverage whatsoever (no Medicaid/ Medicare, etc.) °-- The pt. MUST have a primary care doctor that directs their care regularly and follows them in the community °  °[facility]  (866) 331-1348   °[employer]  (888) 892-1162   ° °Agencies that provide inexpensive medical care: °Organization         Address  Phone   Notes  °[facility]  (336) 832-8035   °[facility]    (336) 832-7272   °[facility] [address] °[place], [place] [address] (336) 832-4777   °[facility] [address], °[place] (336) 271-4999   °[facility]    (336) 373-0678   °[facility]    (336) 272-1050   °[facility] ° [address], [place] Phone:  (336) 832-4444, Fax:  (336) 832-4440 Hours of Operation:  9 am - 6 pm, M-F.  Also accepts Medicaid/Medicare and self-pay.  °[facility] ° [address], [place] Phone: (336) 832-3150, Fax: (336) 832-3151. Hours of Operation:  8:30 am - 5:30 pm, M-F.  Also accepts Medicaid and self-pay.  °[facility] [place] [address], [place] Phone: (336) 878-6027   °[facility] [address], [place], [place] (336)723-1848, Ext. 123 Mondays & Thursdays: 7-9 AM.  First 15 patients are seen on a first come, first serve basis. °  ° °Medicaid-accepting [place] Providers: ° °Organization         Address  Phone   Notes  °[facility] [address], [place] (336) 641-2100 Also accepts self-pay patients.  °[facility] [address], [place] ° (336) 856-9996   °[facility] [address], [place] (336) 288-8857   °[facility] [address] [place] [address], [place] (336) 299-7000   °Veita Bland [address], [place]  ° (336) 373-1557 Only accepts [place] Access Medicaid patients after they have their name applied to their card.  ° °Self-Pay (no insurance) in [place]: ° °Organization           Address  Phone   Notes  °Sickle Cell Patients, [facility] [address], [place] (336) 832-1970   °[facility] [address], [place] (336) 832-4400   °[facility] ° [address], [place] (336) 992-4800   °[facility]/Dr. Osei-Bonsu ° [address] [place] [address], [place] or [address], [place] (336) 841-8500 Phone number for both [place] and [place] locations is the same.  °[facility] [address], [place] (336) 299-0000   °[facility] [address] [place] [address], [place] or [address] (336) 852-7530 °(336) 878-2260   °[facility] [address], [place] (336) 350-1642, phone; (336) 294-5005, fax Sees patients 1st and 3rd Saturday of every month.  Must not qualify for public or private insurance (i.e. Medicaid, Medicare, [place] Health Choice, Veterans' Benefits) • Household income should be no more than 200% of the poverty level •The clinic cannot treat you if you are pregnant or think you are  pregnant • Sexually transmitted diseases are not treated at the clinic.  ° ° °Dental Care: °Organization         Address  Phone  Notes  °[facility] [address], [place] (336) 641-6152 Accepts children up to age 21 who are enrolled in Medicaid or [place] Health Choice; pregnant women with a Medicaid card; and children who have applied for Medicaid or [place] Health Choice, but were declined, whose parents can pay a reduced fee at time of service.  °[facility] [place]  [address], [place] (336) 641-7733 Accepts children up to age 21 who are enrolled in Medicaid or [place] Health Choice; pregnant women with a Medicaid card; and children who have applied for Medicaid or [place] Health Choice, but were declined, whose parents can pay a reduced fee at time of service.  °[facility] ° [address], [place] (336) 641-4533 Patients are seen by appointment only. Walk-ins are not accepted. [facility] will see patients 18 years of age and older. °Monday - Tuesday (8am-5pm) °Most Wednesdays (8:30-5pm) °$30 per visit, cash only  °[facility] ° [address], [place] (336) 641-4533 Patients are seen by appointment only. Walk-ins are not accepted. [facility] will see patients 18 years of age and older. °One Wednesday Evening (Monthly: Volunteer Based).  $30 per visit, cash only  °[facility]  (919) 537-3737 for adults; Children under age 4, call Graduate Pediatric Dentistry at (919) [phone]. Children aged 4-14, please call (919) [phone] to request a pediatric application. ° Dental services are provided in all areas of dental care including fillings, crowns and bridges, complete and partial dentures, implants, gum treatment, root canals, and extractions. Preventive care is also provided. Treatment is provided to both adults and  children. °Patients are selected via a lottery and there is often a waiting list. °  °[facility] [address], ° ° (336) 763-8833 [email or web address] °  °[facility] [address], [place], [place] (336)723-1848, Ext. 123 Second and Fourth Thursday of each month, opens at 6:30 AM; Clinic ends at 9 AM.  Patients are seen on a first-come first-served basis, and a limited number are seen during each clinic.  ° °[facility] ° [address], [place], [place] (336) 723-7904   Eligibility Requirements °You must have lived in [place], [place], or [place] counties for at least the last three months. °  You cannot be eligible for state or federal sponsored healthcare insurance, including Veterans Administration, Medicaid, or Medicare. °  You generally cannot be eligible for healthcare insurance through your employer.  °  How to apply: °Eligibility screenings are held every Tuesday and Wednesday afternoon from 1:00 pm until 4:00 pm. You do not need an appointment for the interview!  °[facility] [address], [place], [place] 336-631-2330   °[facility]  336-342-8273   °[facility]  336-703-3100   °[facility]  336-570-6415   ° °Behavioral Health Resources in the Community: °Intensive Outpatient Programs °Organization         Address  Phone  Notes  °[facility] [address], [place], [place] 336-878-6098   °[facility] [address], [place], [place] 336-832-9800   °[facility] [address], [place], [place] ° 336-882-2125   °[facility] [address],  °[place], [place] 1-800-853-5163 or 336-641-4981   °Substance Abuse Resources °Organization         Address  Phone  Notes  °[facility]  336-882-2125   °[facility]  336-784-9470   °[facility]  336-285-9073    °[facility]  336-845-3988   °Residential & Outpatient Substance Abuse Program  1-800-659-3381   °Psychological Services °Organization         Address  Phone  Notes  °[facility]  336- 832-9600   °[facility]  336- 378-7881   °[facility] [address], [place] 1-800-853-5163 or 336-641-4981   ° °Mobile Crisis Teams °Organization         Address  Phone  Notes  °[facility], Mobile Crisis Care Unit  1-877-626-1772   °Assertive °[facility] ° [address] [place], [place] 336-834-9664   °Sharon DeEsch [address] °[place] 336-554-5454   ° °Self-Help/Support Groups °Organization         Address  Phone             Notes  °[facility] - variety of support groups  336- 373-1402 Call for more information  °Narcotics Anonymous (NA), [facility] [address], °[place]  2 meetings at this location  ° °Residential Treatment Programs °Organization         Address  Phone  Notes  °[facility] [address],    °[place]  1-866-801-8205   °[facility] ° [address], [place], [place] 704-293-8524   °[facility] [address], [place] 336-845-3988 Admissions: 8am-3pm M-F  °[facility] [address]    °[place], [place] 336-841-1104   °[facility] [address], [place], [place] 336-379-7146   °[facility] [address]  °[place], [place] 336-285-9073   °[facility] - Intensive Outpatient [address], [place], [place] 336-852-3033   °[facility] [address]  °[place], [place] 1-877-615-2722 or 336-784-9470   °[facility] [address] [place], [place] 336-227-7417 Accepts Medicaid  °[facility] [address]  °  1-800-659-3381 Substance Abuse/Addiction Treatment  ° °[place]  Behavioral Health Resources °Organization           Address  Phone  Notes  °[facility]  (888) 581-9988   °Julie Brannon, PhD [address], [place]   (336) 349-5553 or (336) 951-0000   °[facility]   [address] °[place], [place] (336) 349-4454   °[facility] [address], [place], [place] (336) 342-8316 Insurance/Medicaid/sponsorship through [facility]  °[facility] [address]                                    [place], [place] (336) 342-8316 Therapy/tele-psych/case  °[facility] [address]  ° [place], [place] (336) 349-2233    °Dr. Arfeen  (336) 349-4544   °[facility] [place]  [employer] [place] [facility] 1) [address], [place] °2) [address], [place] °3)  [address], [place] (336) 349-3220 °(336) 342-7768 ° °(336) 342-8140   °[place] [facility] (336) 342-1394 or (336) 342-3537 (After Hours)    ° ° ° °

## 2014-05-30 NOTE — ED Notes (Signed)
C/o chronic neck pain, rt facial pain,  Increased last 3 days with numbness to left arm,  Unable to lift left arm and swelling bilateral arm and hand swelling  Pos radial pulse

## 2014-05-30 NOTE — ED Provider Notes (Signed)
CSN: [id number]     Arrival date & time 05/30/14  1925 History  This chart was scribed for Dagmar HaitWilliam Clover Feehan, MD by Evon Slackerrance Branch, ED Scribe. This patient was seen in room MH02/MH02 and the patient's care was started at 7:58 PM.    Chief Complaint  Patient presents with  . Neck Pain   Patient is a 33 y.o. female presenting with neck pain. The history is provided by the patient. No language interpreter was used.  Neck Pain Pain radiates to:  Does not radiate Pain severity:  Moderate Onset quality:  Gradual Duration:  3 days Timing:  Constant Progression:  Worsening Relieved by:  Nothing Exacerbated by: lying down. Associated symptoms: numbness and weakness   Associated symptoms: no fever    HPI Comments: Roberto ScalesMelissa Hoch is a 33 y.o. female who presents to the Emergency Department complaining of neck pain. She states she has a h/o cervical neck surgery and had her C5 and C6 replaced and fused. She states that she has been taking her tramadol for pain with no relief to her symptoms. She states she has not been taking her Skelaxin as prescribed. She states she has associated tingling in her arm and swelling in her arms onset 3 days. She states that she hasn't had swelling her arms since prior to her neck surgery. She states she is not able to lift more than 10lbs. She states that the pain worsens when she lays down. Denies fever or other symptoms.   History reviewed. No pertinent past medical history. Past Surgical History  Procedure Laterality Date  . Cervical spine surgery    . Tubal ligation     History reviewed. No pertinent family history. History  Substance Use Topics  . Smoking status: Current Every Day Smoker -- 0.50 packs/day    Types: Cigarettes  . Smokeless tobacco: Not on file  . Alcohol Use: No   OB History   Grav Para Term Preterm Abortions TAB SAB Ect Mult Living                 Review of Systems  Constitutional: Negative for fever.  Musculoskeletal:  Positive for neck pain.  Neurological: Positive for weakness and numbness.  All other systems reviewed and are negative.     Allergies  Review of patient's allergies indicates no known allergies.  Home Medications   Prior to Admission medications   Medication Sig Start Date End Date Taking? Authorizing Provider  gabapentin (NEURONTIN) 300 MG capsule Take 300 mg by mouth 3 (three) times daily.    Historical Provider, MD  ibuprofen (ADVIL,MOTRIN) 200 MG tablet Take 800 mg by mouth every 6 (six) hours as needed for moderate pain.    Historical Provider, MD  ketorolac (TORADOL) 10 MG tablet Take 10 mg by mouth every 6 (six) hours as needed for moderate pain.    Historical Provider, MD  lidocaine (LIDODERM) 5 % Place 1 patch onto the skin every 12 (twelve) hours. Remove & Discard patch within 12 hours or as directed by MD    Historical Provider, MD  metaxalone (SKELAXIN) 800 MG tablet Take 800 mg by mouth 3 (three) times daily as needed for muscle spasms.     Historical Provider, MD  methylPREDNISolone (MEDROL) 4 MG tablet Take 4-24 mg by mouth daily. She took for 6 days and started it on 03/17/14. She took six tablets on day 1, five tablets on day 2, four tablets on day 3, three tablets on day 4, two tablets  on day 5 and one tablet on day 6. She completed the regimen yesterday (03/22/14).    Historical Provider, MD  oxyCODONE-acetaminophen (PERCOCET) 5-325 MG per tablet Take 1 tablet by mouth every 4 (four) hours as needed. 03/23/14   Shanna Cisco, MD  RASPBERRY KETONES PO Take 2 tablets by mouth every morning.    Historical Provider, MD  traMADol (ULTRAM) 50 MG tablet Take 50 mg by mouth every 6 (six) hours as needed for moderate pain.    Historical Provider, MD   Triage Vitals: BP 146/87  Pulse 96  Temp(Src) 98.2 F (36.8 C) (Oral)  Resp 20  SpO2 99%  LMP 05/21/2014  Physical Exam  Nursing note and vitals reviewed. Constitutional: She is oriented to person, place, and time. She appears  well-developed and well-nourished. No distress.  HENT:  Head: Normocephalic and atraumatic.  Mouth/Throat: Oropharynx is clear and moist. No oropharyngeal exudate.  Eyes: Conjunctivae and EOM are normal.  Neck: Normal range of motion. No tracheal deviation present.  Cardiovascular: Normal rate.   Pulmonary/Chest: Effort normal. No respiratory distress.  Abdominal: Soft. She exhibits no distension and no mass. There is no tenderness. There is no rebound and no guarding.  Musculoskeletal: She exhibits no edema (no appreciable edema of hands).       Cervical back: She exhibits decreased range of motion and tenderness. She exhibits no bony tenderness, no swelling and no edema.  Diffuse musculature tenderness  Neurological: She is alert and oriented to person, place, and time.  Skin: Skin is warm and dry.  Psychiatric: She has a normal mood and affect. Her behavior is normal.    ED Course  Procedures (including critical care time) DIAGNOSTIC STUDIES: Oxygen Saturation is 99% on RA, normal by my interpretation.    COORDINATION OF CARE: 8:10 PM-Discussed treatment plan which includes pain medication with pt at bedside and pt agreed to plan.     Labs Review Labs Reviewed - No data to display  Imaging Review No results found.   EKG Interpretation None      MDM   Final diagnoses:  Neck pain   33 year old female presents with neck pain. Prior C5 and C6 fusion surgery. Has been having pain persistently since the surgery, but occasionally has flareups. She is on tramadol for maintenance pain medication. No injury, but pain worsening for past 2 weeks. She states some intermittent tingling and paresthesias in her arms. She states they feel weak and she cannot move them. I suspect most of her symptoms in her arms are due to muscle spasm in her neck. She has severe spasm in her neck with pain. No bony tenderness. She is neurovascular intact otherwise. Patient given pain medicine and muscle  relaxants.  Of note, she stated to me she was having swelling in her hands, I cannot appreciate any hand swelling. There is no pitting edema. I suspect since she is not moving her arms up high, she could be having some fullness sensation due to not moving her arms due to her neck pain. On reexam, strength exam is improved since the pain is decreased. She feels much more comfortable. She is stable for discharge. Instructed to follow with her PCP.     I personally performed the services described in this documentation, which was scribed in my presence. The recorded information has been reviewed and is accurate.     Dagmar Hait, MD 05/30/14 [phone]

## 2014-10-09 ENCOUNTER — Encounter (HOSPITAL_BASED_OUTPATIENT_CLINIC_OR_DEPARTMENT_OTHER): Payer: Self-pay | Admitting: Emergency Medicine

## 2014-10-09 ENCOUNTER — Emergency Department (HOSPITAL_BASED_OUTPATIENT_CLINIC_OR_DEPARTMENT_OTHER): Payer: Self-pay

## 2014-10-09 ENCOUNTER — Emergency Department (HOSPITAL_BASED_OUTPATIENT_CLINIC_OR_DEPARTMENT_OTHER)
Admission: EM | Admit: 2014-10-09 | Discharge: 2014-10-09 | Disposition: A | Discharge status: Home / Self Care | Payer: Self-pay | Attending: Emergency Medicine | Admitting: Emergency Medicine

## 2014-10-09 DIAGNOSIS — R55 Syncope and collapse: Secondary | ICD-10-CM | POA: Insufficient documentation

## 2014-10-09 DIAGNOSIS — S199XXA Unspecified injury of neck, initial encounter: Secondary | ICD-10-CM | POA: Insufficient documentation

## 2014-10-09 DIAGNOSIS — R51 Headache: Secondary | ICD-10-CM

## 2014-10-09 DIAGNOSIS — W06XXXA Fall from bed, initial encounter: Secondary | ICD-10-CM | POA: Insufficient documentation

## 2014-10-09 DIAGNOSIS — R519 Headache, unspecified: Secondary | ICD-10-CM

## 2014-10-09 DIAGNOSIS — W19XXXA Unspecified fall, initial encounter: Secondary | ICD-10-CM

## 2014-10-09 DIAGNOSIS — Z981 Arthrodesis status: Secondary | ICD-10-CM | POA: Insufficient documentation

## 2014-10-09 DIAGNOSIS — G8929 Other chronic pain: Secondary | ICD-10-CM | POA: Insufficient documentation

## 2014-10-09 DIAGNOSIS — Z79899 Other long term (current) drug therapy: Secondary | ICD-10-CM | POA: Insufficient documentation

## 2014-10-09 DIAGNOSIS — Y9289 Other specified places as the place of occurrence of the external cause: Secondary | ICD-10-CM | POA: Insufficient documentation

## 2014-10-09 DIAGNOSIS — Y9389 Activity, other specified: Secondary | ICD-10-CM | POA: Insufficient documentation

## 2014-10-09 DIAGNOSIS — Z720 Tobacco use: Secondary | ICD-10-CM | POA: Insufficient documentation

## 2014-10-09 DIAGNOSIS — S0990XA Unspecified injury of head, initial encounter: Secondary | ICD-10-CM | POA: Insufficient documentation

## 2014-10-09 HISTORY — DX: Cervicalgia: M54.2

## 2014-10-09 HISTORY — DX: Other chronic pain: G89.29

## 2014-10-09 LAB — CBC WITH DIFFERENTIAL/PLATELET
Basophils Absolute: 0 10*3/uL (ref 0.0–0.1)
Basophils Relative: 0 % (ref 0–1)
Eosinophils Absolute: 0.1 10*3/uL (ref 0.0–0.7)
Eosinophils Relative: 1 % (ref 0–5)
HEMATOCRIT: 38.6 % (ref 36.0–46.0)
Hemoglobin: 13 g/dL (ref 12.0–15.0)
LYMPHS PCT: 31 % (ref 12–46)
Lymphs Abs: 3.8 10*3/uL (ref 0.7–4.0)
MCH: 30.4 pg (ref 26.0–34.0)
MCHC: 33.7 g/dL (ref 30.0–36.0)
MCV: 90.4 fL (ref 78.0–100.0)
MONO ABS: 0.7 10*3/uL (ref 0.1–1.0)
MONOS PCT: 6 % (ref 3–12)
Neutro Abs: 7.8 10*3/uL — ABNORMAL HIGH (ref 1.7–7.7)
Neutrophils Relative %: 62 % (ref 43–77)
Platelets: 298 10*3/uL (ref 150–400)
RBC: 4.27 MIL/uL (ref 3.87–5.11)
RDW: 13.8 % (ref 11.5–15.5)
WBC: 12.4 10*3/uL — AB (ref 4.0–10.5)

## 2014-10-09 LAB — BASIC METABOLIC PANEL
Anion gap: 12 (ref 5–15)
BUN: 8 mg/dL (ref 6–23)
CALCIUM: 8.6 mg/dL (ref 8.4–10.5)
CO2: 24 meq/L (ref 19–32)
CREATININE: 0.6 mg/dL (ref 0.50–1.10)
Chloride: 106 mEq/L (ref 96–112)
GFR calc Af Amer: >90 mL/min (ref >90)
GFR calc non Af Amer: >90 mL/min (ref >90)
Glucose, Bld: 88 mg/dL (ref 70–99)
Potassium: 3.8 mEq/L (ref 3.7–5.3)
Sodium: 142 mEq/L (ref 137–147)

## 2014-10-09 MED ORDER — ONDANSETRON HCL 4 MG/2ML IJ SOLN
4.0000 mg | Freq: Once | INTRAMUSCULAR | Status: AC
  Administered 2014-10-09: 4 mg via INTRAVENOUS
  Filled 2014-10-09: qty 2

## 2014-10-09 MED ORDER — SODIUM CHLORIDE 0.9 % IV BOLUS (SEPSIS)
1000.0000 mL | Freq: Once | INTRAVENOUS | Status: AC
  Administered 2014-10-09: 1000 mL via INTRAVENOUS

## 2014-10-09 MED ORDER — HYDROMORPHONE HCL 1 MG/ML IJ SOLN
1.0000 mg | Freq: Once | INTRAMUSCULAR | Status: AC
  Administered 2014-10-09: 1 mg via INTRAVENOUS
  Filled 2014-10-09: qty 1

## 2014-10-09 MED ORDER — KETOROLAC TROMETHAMINE 30 MG/ML IJ SOLN
30.0000 mg | Freq: Once | INTRAMUSCULAR | Status: AC
  Administered 2014-10-09: 30 mg via INTRAVENOUS
  Filled 2014-10-09: qty 1

## 2014-10-09 NOTE — ED Notes (Signed)
NP at bedside discussing test results and dispo plan of care. Pt voiced understanding and agreement with plan.

## 2014-10-09 NOTE — ED Notes (Addendum)
Pt c/o neck injury fall from bed x 1 hr ago,  NO LOCHX of neck pain and surgery

## 2014-10-09 NOTE — Discharge Instructions (Signed)
Please follow the directions provided.  Be sure to follow-up with your primary care provider regarding your chronic neck pain.  Don't hesitate to return for any new, worsening or concerning symptoms.    SEEK IMMEDIATE MEDICAL CARE IF:  You have a severe headache.  You have unusual pain in the chest, abdomen, or back.  You are bleeding from the mouth or rectum, or you have black or tarry stool.  You have an irregular or very fast heartbeat.  You have repeated fainting or have seizure-like jerking during an episode.  You faint when sitting or lying down.  You have confusion.  You have difficulty walking.  You have severe weakness.  You have vision problems.

## 2014-10-09 NOTE — ED Notes (Signed)
When in to draw labs that were now ordered-pt states she was advised by her lawyer that was called prior to coming to the ED not to allow us to draw blood work-pt notified labs were ordered to assess her c/o of syncopal episode and that she can refuse which may then end further treatment-pt agreed to labs

## 2014-10-09 NOTE — ED Notes (Signed)
Pt cursing in traige demanding to go straight back to a room d/t pain

## 2014-10-09 NOTE — ED Provider Notes (Signed)
CSN: [id number]     Arrival date & time 10/09/14  1913 History   First MD Initiated Contact with Patient 10/09/14 2011     Chief Complaint  Patient presents with  . Neck Injury   (Consider location/radiation/quality/duration/timing/severity/associated sxs/prior Treatment) HPI Brittany Valdez is a 33 yo female presenting with neck pain and headache after falling backwards from sitting position off of bed on to floor.  She reports thinking she may have passed out from chronic neck pain causing the fall. After the fall, she landed on her back.  She reports 10/10 neck and headache after the fall.  Appr 30 min after the fall she reports vomiting x 4.  She denies focal deficits or gait disturbances.  Past Medical History  Diagnosis Date  . Chronic neck pain    Past Surgical History  Procedure Laterality Date  . Cervical spine surgery    . Tubal ligation     History reviewed. No pertinent family history. History  Substance Use Topics  . Smoking status: Current Every Day Smoker -- 0.50 packs/day    Types: Cigarettes  . Smokeless tobacco: Not on file  . Alcohol Use: No   OB History   Grav Para Term Preterm Abortions TAB SAB Ect Mult Living                 Review of Systems  Constitutional: Negative for fever and chills.  HENT: Negative for sore throat.   Eyes: Positive for visual disturbance.  Respiratory: Negative for cough and shortness of breath.   Cardiovascular: Negative for chest pain and leg swelling.  Gastrointestinal: Positive for nausea and vomiting. Negative for diarrhea.  Genitourinary: Negative for dysuria.  Musculoskeletal: Positive for neck pain. Negative for myalgias.  Skin: Negative for rash.  Neurological: Positive for syncope and headaches. Negative for weakness and numbness.      Allergies  Review of patient's allergies indicates no known allergies.  Home Medications   Prior to Admission medications   Medication Sig Start Date End Date Taking?  Authorizing Provider  gabapentin (NEURONTIN) 300 MG capsule Take 300 mg by mouth 3 (three) times daily.    Historical Provider, MD  ibuprofen (ADVIL,MOTRIN) 200 MG tablet Take 800 mg by mouth every 6 (six) hours as needed for moderate pain.    Historical Provider, MD  ketorolac (TORADOL) 10 MG tablet Take 10 mg by mouth every 6 (six) hours as needed for moderate pain.    Historical Provider, MD  lidocaine (LIDODERM) 5 % Place 1 patch onto the skin every 12 (twelve) hours. Remove & Discard patch within 12 hours or as directed by MD    Historical Provider, MD  metaxalone (SKELAXIN) 800 MG tablet Take 800 mg by mouth 3 (three) times daily as needed for muscle spasms.     Historical Provider, MD  methylPREDNISolone (MEDROL) 4 MG tablet Take 4-24 mg by mouth daily. She took for 6 days and started it on 03/17/14. She took six tablets on day 1, five tablets on day 2, four tablets on day 3, three tablets on day 4, two tablets on day 5 and one tablet on day 6. She completed the regimen yesterday (03/22/14).    Historical Provider, MD  oxyCODONE-acetaminophen (PERCOCET) 5-325 MG per tablet Take 1 tablet by mouth every 4 (four) hours as needed. 03/23/14   Toy CookeyMegan Docherty, MD  oxyCODONE-acetaminophen (PERCOCET) 5-325 MG per tablet Take 1 tablet by mouth every 6 (six) hours as needed for moderate pain. 05/30/14   Carlena SaxBlair  Gwendolyn Grant, MD  RASPBERRY KETONES PO Take 2 tablets by mouth every morning.    Historical Provider, MD  traMADol (ULTRAM) 50 MG tablet Take 50 mg by mouth every 6 (six) hours as needed for moderate pain.    Historical Provider, MD   BP 141/80  Pulse 100  Temp(Src) 97.6 F (36.4 C)  SpO2 100%  LMP 09/19/2014 Physical Exam  Nursing note and vitals reviewed. Constitutional: She is oriented to person, place, and time. She appears well-developed and well-nourished. No distress.  HENT:  Head: Normocephalic and atraumatic.  Mouth/Throat: Oropharynx is clear and moist. No oropharyngeal exudate.  Eyes:  Conjunctivae are normal.  Neck: Neck supple. No rigidity. No Brudzinski's sign and no Kernig's sign noted. No thyromegaly present.  Pt has decreased nuchal ROm due to cervical spinal fusion.  Her ROM is at baseline  Cardiovascular: Normal rate, regular rhythm and intact distal pulses.   Pulmonary/Chest: Effort normal and breath sounds normal. No respiratory distress. She has no wheezes. She has no rales. She exhibits no tenderness.  Abdominal: Soft. There is no tenderness.  Musculoskeletal: She exhibits no tenderness.  Lymphadenopathy:    She has no cervical adenopathy.  Neurological: She is alert and oriented to person, place, and time. No cranial nerve deficit.  Skin: Skin is warm and dry. No rash noted. She is not diaphoretic.  Psychiatric: She has a normal mood and affect.    ED Course  Procedures (including critical care time) Labs Review Labs Reviewed  CBC WITH DIFFERENTIAL - Abnormal; Notable for the following:    WBC 12.4 (*)    Neutro Abs 7.8 (*)    All other components within normal limits  BASIC METABOLIC PANEL    Imaging Review Ct Head Wo Contrast  10/09/2014   CLINICAL DATA:  Syncope/ loss of consciousness with fall out of bed. Posterior head headache and neck pain. History of cervical spine fusion.  EXAM: CT HEAD WITHOUT CONTRAST  CT CERVICAL SPINE WITHOUT CONTRAST  TECHNIQUE: Multidetector CT imaging of the head and cervical spine was performed following the standard protocol without intravenous contrast. Multiplanar CT image reconstructions of the cervical spine were also generated.  COMPARISON:  CT head on 03/21/2014 and cervical spine CT on 02/26/2014.  FINDINGS: CT HEAD FINDINGS  The brain demonstrates no evidence of hemorrhage, infarction, edema, mass effect, extra-axial fluid collection, hydrocephalus or mass lesion. The skull is unremarkable.  CT CERVICAL SPINE FINDINGS  The cervical spine shows loss of lordosis without subluxation. There is no evidence of acute  fracture or subluxation. No soft tissue swelling or hematoma is identified. Stable positioning of anterior cervical fusion plate at W0-9 with intervening bone allograft. No abnormal lucency around cervical hardware.  Moderate spondylosis again noted at C6-7 with some mild increased prominence of anterior osteophytes at this level.  No bony or soft tissue lesions are seen. The visualized airway is normally patent.  IMPRESSION: Normal head CT. Stable appearance of cervical spine with mild progression of osteophytes at C6-7. No acute cervical spine findings.   Electronically Signed   By: Irish Lack M.D.   On: 10/09/2014 21:57   Ct Cervical Spine Wo Contrast  10/09/2014   CLINICAL DATA:  Syncope/ loss of consciousness with fall out of bed. Posterior head headache and neck pain. History of cervical spine fusion.  EXAM: CT HEAD WITHOUT CONTRAST  CT CERVICAL SPINE WITHOUT CONTRAST  TECHNIQUE: Multidetector CT imaging of the head and cervical spine was performed following the standard protocol without intravenous  contrast. Multiplanar CT image reconstructions of the cervical spine were also generated.  COMPARISON:  CT head on 03/21/2014 and cervical spine CT on 02/26/2014.  FINDINGS: CT HEAD FINDINGS  The brain demonstrates no evidence of hemorrhage, infarction, edema, mass effect, extra-axial fluid collection, hydrocephalus or mass lesion. The skull is unremarkable.  CT CERVICAL SPINE FINDINGS  The cervical spine shows loss of lordosis without subluxation. There is no evidence of acute fracture or subluxation. No soft tissue swelling or hematoma is identified. Stable positioning of anterior cervical fusion plate at Z6-1C5-6 with intervening bone allograft. No abnormal lucency around cervical hardware.  Moderate spondylosis again noted at C6-7 with some mild increased prominence of anterior osteophytes at this level.  No bony or soft tissue lesions are seen. The visualized airway is normally patent.  IMPRESSION: Normal  head CT. Stable appearance of cervical spine with mild progression of osteophytes at C6-7. No acute cervical spine findings.   Electronically Signed   By: Irish LackGlenn  Yamagata M.D.   On: 10/09/2014 21:57     EKG Interpretation   Date/Time:  Tuesday October 09 2014 21:33:39 EDT Ventricular Rate:  81 PR Interval:  126 QRS Duration: 90 QT Interval:  370 QTC Calculation: 429 R Axis:   52 Text Interpretation:  Normal sinus rhythm Cannot rule out Anterior infarct  , age undetermined Abnormal ECG Similar to prior Confirmed by Gwendolyn GrantWALDEN  MD,  BLAIR [phone]([id number]) on 10/09/2014 9:36:30 PM      MDM   Final diagnoses:  Near syncope  Acute nonintractable headache, unspecified headache type  Fall, initial encounter   33 yo female with neck pain and headache after falling off bed.  Has history of spinal fusion surgeries and pending worker's comp case.  CBC, BMP, EKG, NS bolus, Toradol and zofran, CT head and C-spine.  9:45 PM Nausea improved, pt requesting something to drink, however, pain unchanged, discussed will give 1 dose of pain meds while awaiting imaging.  Pt alert, vital signs improved, no acute distress, pain managed, baseline range of motion in neck, appears safe to be discharged home, pt requesting one more dose tonight prior to visiting her pain MD. She reports her pain is tolerable now but she ants to make sure she will "get through the night" Discussed pain med last given was fairly strong and as there are no new acute injuries from CT scans, it should be sufficient until she can get recommendations on mgmt of her chronic pain tomorrow.  Pt is agreeable to this plan.      Filed Vitals:   10/09/14 1924 10/09/14 2137 10/09/14 2200  BP: 141/80 115/75 130/85  Pulse: 100 72 80  Temp: 97.6 F (36.4 C) 98.3 F (36.8 C)   TempSrc:  Oral   Resp:  18   SpO2: 100% 100% 99%   Meds given in ED:  Medications  sodium chloride 0.9 % bolus 1,000 mL (1,000 mLs Intravenous New Bag/Given 10/09/14 2109)   ketorolac (TORADOL) 30 MG/ML injection 30 mg (30 mg Intravenous Given 10/09/14 2106)  ondansetron (ZOFRAN) injection 4 mg (4 mg Intravenous Given 10/09/14 2106)  HYDROmorphone (DILAUDID) injection 1 mg (1 mg Intravenous Given 10/09/14 2145)    New Prescriptions   No medications on file       Harle BattiestElizabeth Anikka Marsan, NP 10/14/14 2240

## 2014-10-09 NOTE — ED Notes (Signed)
When pt advised of pain med given-pt states she knows that is not going to relieve her pain-EDNP notified

## 2014-10-15 NOTE — ED Provider Notes (Signed)
Medical screening examination/treatment/procedure(s) were performed by non-physician practitioner and as supervising physician I was immediately available for consultation/collaboration.   EKG Interpretation   Date/Time:  Tuesday October 09 2014 21:33:39 EDT Ventricular Rate:  81 PR Interval:  126 QRS Duration: 90 QT Interval:  370 QTC Calculation: 429 R Axis:   52 Text Interpretation:  Normal sinus rhythm Cannot rule out Anterior infarct  , age undetermined Abnormal ECG Similar to prior Confirmed by Gwendolyn GrantWALDEN  MD,  Memory Heinrichs ([id number]) on 10/09/2014 9:36:30 PM        Elwin MochaBlair Jamir Rone, MD 10/15/14 [id number]

## 2015-01-27 ENCOUNTER — Encounter (HOSPITAL_BASED_OUTPATIENT_CLINIC_OR_DEPARTMENT_OTHER): Payer: Self-pay | Admitting: *Deleted

## 2015-01-27 ENCOUNTER — Emergency Department (HOSPITAL_BASED_OUTPATIENT_CLINIC_OR_DEPARTMENT_OTHER)
Admission: EM | Admit: 2015-01-27 | Discharge: 2015-01-27 | Disposition: A | Discharge status: Home / Self Care | Payer: Self-pay | Attending: Emergency Medicine | Admitting: Emergency Medicine

## 2015-01-27 DIAGNOSIS — G8929 Other chronic pain: Secondary | ICD-10-CM | POA: Insufficient documentation

## 2015-01-27 DIAGNOSIS — R42 Dizziness and giddiness: Secondary | ICD-10-CM | POA: Insufficient documentation

## 2015-01-27 DIAGNOSIS — R1111 Vomiting without nausea: Secondary | ICD-10-CM | POA: Insufficient documentation

## 2015-01-27 DIAGNOSIS — Z720 Tobacco use: Secondary | ICD-10-CM | POA: Insufficient documentation

## 2015-01-27 DIAGNOSIS — T50905A Adverse effect of unspecified drugs, medicaments and biological substances, initial encounter: Secondary | ICD-10-CM

## 2015-01-27 DIAGNOSIS — R200 Anesthesia of skin: Secondary | ICD-10-CM | POA: Insufficient documentation

## 2015-01-27 DIAGNOSIS — Z79899 Other long term (current) drug therapy: Secondary | ICD-10-CM | POA: Insufficient documentation

## 2015-01-27 DIAGNOSIS — H538 Other visual disturbances: Secondary | ICD-10-CM | POA: Insufficient documentation

## 2015-01-27 DIAGNOSIS — M542 Cervicalgia: Secondary | ICD-10-CM | POA: Insufficient documentation

## 2015-01-27 DIAGNOSIS — T404X5A Adverse effect of other synthetic narcotics, initial encounter: Secondary | ICD-10-CM | POA: Insufficient documentation

## 2015-01-27 DIAGNOSIS — R443 Hallucinations, unspecified: Secondary | ICD-10-CM | POA: Insufficient documentation

## 2015-01-27 LAB — CBC WITH DIFFERENTIAL/PLATELET
BASOS ABS: 0 10*3/uL (ref 0.0–0.1)
Basophils Relative: 0 % (ref 0–1)
Eosinophils Absolute: 0.1 10*3/uL (ref 0.0–0.7)
Eosinophils Relative: 1 % (ref 0–5)
HCT: 38.8 % (ref 36.0–46.0)
Hemoglobin: 12.9 g/dL (ref 12.0–15.0)
LYMPHS ABS: 2.4 10*3/uL (ref 0.7–4.0)
LYMPHS PCT: 39 % (ref 12–46)
MCH: 30.6 pg (ref 26.0–34.0)
MCHC: 33.2 g/dL (ref 30.0–36.0)
MCV: 91.9 fL (ref 78.0–100.0)
MONO ABS: 0.3 10*3/uL (ref 0.1–1.0)
MONOS PCT: 5 % (ref 3–12)
Neutro Abs: 3.3 10*3/uL (ref 1.7–7.7)
Neutrophils Relative %: 55 % (ref 43–77)
PLATELETS: 250 10*3/uL (ref 150–400)
RBC: 4.22 MIL/uL (ref 3.87–5.11)
RDW: 12.9 % (ref 11.5–15.5)
WBC: 6.1 10*3/uL (ref 4.0–10.5)

## 2015-01-27 LAB — COMPREHENSIVE METABOLIC PANEL
ALBUMIN: 3.9 g/dL (ref 3.5–5.2)
ALT: 13 U/L (ref 0–35)
ANION GAP: 4 — AB (ref 5–15)
AST: 16 U/L (ref 0–37)
Alkaline Phosphatase: 50 U/L (ref 39–117)
BILIRUBIN TOTAL: 0.3 mg/dL (ref 0.3–1.2)
BUN: 11 mg/dL (ref 6–23)
CALCIUM: 8.7 mg/dL (ref 8.4–10.5)
CHLORIDE: 108 mmol/L (ref 96–112)
CO2: 26 mmol/L (ref 19–32)
Creatinine, Ser: 0.63 mg/dL (ref 0.50–1.10)
GFR calc non Af Amer: >90 mL/min (ref >90)
Glucose, Bld: 100 mg/dL — ABNORMAL HIGH (ref 70–99)
POTASSIUM: 3.6 mmol/L (ref 3.5–5.1)
SODIUM: 138 mmol/L (ref 135–145)
TOTAL PROTEIN: 6.5 g/dL (ref 6.0–8.3)

## 2015-01-27 NOTE — ED Notes (Signed)
Recently being worked up to discern new dx of Bipolar or schizophrenia [facility] recently started on Risperdal 4mg  and tramadol 300mg . here for 2d of sx. C/o nausea, dizziness, "things are shifting", insomnia and chronic pain. Reports chronic neck pain 7/10.

## 2015-01-27 NOTE — Discharge Instructions (Signed)
Your symptoms seem to be from your new medication, Risperdal. I suggest you reduce the dose to 2 mg morning and night or stop it altogether and call your psychiatrist as soon as possible for further medication adjustment.. If your symptoms worsen or new symptoms develop return to the ER.   Risperidone tablets What is this medicine? RISPERIDONE (ris PER i done) is an antipsychotic. It is used to treat schizophrenia, bipolar disorder, and some symptoms of autism. This medicine may be used for other purposes; ask your health care provider or pharmacist if you have questions. COMMON BRAND NAME(S): Risperdal What should I tell my health care provider before I take this medicine? They need to know if you have any of these conditions: -blood disorder or disease -dementia -diabetes or a family history of diabetes -difficulty swallowing -heart disease or previous heart attack -history of brain tumor or head injury -history of breast cancer -irregular heartbeat or low blood pressure -kidney or liver disease -Parkinson's disease -seizures (convulsions) -an unusual or allergic reaction to risperidone, other medicines, foods, dyes, or preservatives -pregnant or trying to get pregnant -breast-feeding How should I use this medicine? Take this medicine by mouth with a glass of water. Follow the directions on the prescription label. You can take it with or without food. If it upsets your stomach, take it with food. Take your medicine at regular intervals. Do not take it more often than directed. Do not stop taking except on your doctor's advice. Talk to your pediatrician regarding the use of this medicine in children. While this drug may be prescribed for children as young as 575 years of age for selected conditions, precautions do apply. Overdosage: If you think you have taken too much of this medicine contact a poison control center or emergency room at once. NOTE: This medicine is only for you. Do not  share this medicine with others. What if I miss a dose? If you miss a dose, take it as soon as you can. If it is almost time for your next dose, take only that dose. Do not take double or extra doses. What may interact with this medicine? Do not take this medicine with any of the following medications: -certain medicines for fungal infections like fluconazole, itraconazole, ketoconazole, posaconazole, voriconazole -cisapride -dofetilide -dronedarone -droperidol -pimozide -sparfloxacin -thioridazine -ziprasidone This medicine may also interact with the following medications: -arsenic trioxide -certain antibiotics like clarithromycin, gatifloxacin, levofloxacin, moxifloxacin, pentamidine, rifampin -certain medicines for blood pressure -certain medicines for cancer -certain medicines for irregular heart beat -certain medications for Parkinson's disease like levodopa -certain medicines for seizures like carbamazepine -certain medicines for sleep or sedation -narcotic medicines for pain -other medicines for mental anxiety, depression, or psychotic disturbances -other medicines that prolong the QT interval (cause an abnormal heart rhythm) -ritonavir This list may not describe all possible interactions. Give your health care provider a list of all the medicines, herbs, non-prescription drugs, or dietary supplements you use. Also tell them if you smoke, drink alcohol, or use illegal drugs. Some items may interact with your medicine. What should I watch for while using this medicine? Visit your doctor or health care professional for regular checks on your progress. It may be several weeks before you see the full effects. Do not suddenly stop taking this medicine. You may need to gradually reduce the dose. Only stop taking this medicine on the advice of your doctor or health care professional. Bonita QuinYou may get dizzy or drowsy. Do not drive, use machinery, or do  anything that needs mental alertness  until you know how this medicine affects you. Do not stand or sit up quickly, especially if you are an older patient. This reduces the risk of dizzy or fainting spells. Alcohol can increase dizziness and drowsiness. Avoid alcoholic drinks. You can get a hangover effect the morning after a bedtime dose. Do not treat yourself for colds, diarrhea or allergies. Ask your doctor or health care professional for advice, some nonprescription medicines may increase possible side effects. This medicine can reduce the response of your body to heat or cold. Dress warm in cold weather and stay hydrated in hot weather. If possible, avoid extreme temperatures like saunas, hot tubs, very hot or cold showers, or activities that can cause dehydration such as vigorous exercise. What side effects may I notice from receiving this medicine? Side effects that you should report to your doctor or health care professional as soon as possible: -aching muscles and joints -confusion -fainting spells -fast or irregular heartbeat -fever or chills, sore throat -increased hunger or thirst -increased urination -loss of balance, difficulty walking or falls -stiffness, spasms, trembling -uncontrollable head, mouth, neck, arm, or leg movements -unusually weak or tired Side effects that usually do not require medical attention (report to your doctor or health care professional if they continue or are bothersome): -constipation -decreased sexual ability -difficulty sleeping -drowsiness or dizziness -increase or decrease in saliva -nausea, vomiting -weight gain This list may not describe all possible side effects. Call your doctor for medical advice about side effects. You may report side effects to FDA at 1-800-FDA-1088. Where should I keep my medicine? Keep out of the reach of children. Store at room temperature between 15 and 25 degrees C (59 and 77 degrees F). Protect from light. Throw away any unused medicine after the  expiration date. NOTE: This sheet is a summary. It may not cover all possible information. If you have questions about this medicine, talk to your doctor, pharmacist, or health care provider.  2015, [employer]vier/Gold Standard. (2013-07-03 14:22:54)

## 2015-01-27 NOTE — ED Notes (Signed)
No dystonia noted patient is alert x4 denies current hallucinations and is verbally appropriate with respnse to questions. Affect is flat but mood is stable, agreeable, cooperative and complaint. Vital signs are stable

## 2015-01-27 NOTE — ED Provider Notes (Signed)
CSN: [id number]     Arrival date & time 01/27/15  0456 History   First MD Initiated Contact with Patient 01/27/15 0518     Chief Complaint  Patient presents with  . Medication Reaction     (Consider location/radiation/quality/duration/timing/severity/associated sxs/prior Treatment) HPI  34 year old female presents with concern for a reaction/side effect to her Risperdal. 2 days ago she started on 4 mg total to be taken 8 mg total per day. Was told she could take 4 mg AM or 4 mg PM vs 2 mg AM and 6 mg PM. This is for treatment of hallucinations for bipolar vs schizophrenia. [place]. Since she started taking Risperdal the hallucinations are gone but now everything is "shifting". Everything she looks at seems to shift Medical laboratory scientific officer(chairs, people, etc). She got dizzy and vomited on the car ride up here. When she takes 2 mg risperdal she does not get these symptoms. No fevers, abdominal pain or other new symptoms. She laid side-ways because of these symptoms and exacerbated her chronic neck pain. Has recurrent numbness in fingers that is chronic with her neck pain but denies new weakness or bowel/bladder incontinence. Currently takes tramadol given to her by [place] pain specialist in [place].   Past Medical History  Diagnosis Date  . Chronic neck pain    Past Surgical History  Procedure Laterality Date  . Cervical spine surgery    . Tubal ligation     No family history on file. History  Substance Use Topics  . Smoking status: Current Every Day Smoker -- 0.50 packs/day    Types: Cigarettes  . Smokeless tobacco: Not on file  . Alcohol Use: No   OB History    No data available     Review of Systems  Eyes: Positive for visual disturbance.  Gastrointestinal: Positive for vomiting. Negative for abdominal pain.  Musculoskeletal: Positive for neck pain.  Neurological: Positive for dizziness and numbness. Negative for headaches.  Psychiatric/Behavioral: Positive for  hallucinations. Negative for suicidal ideas.  All other systems reviewed and are negative.     Allergies  Review of patient's allergies indicates no known allergies.  Home Medications   Prior to Admission medications   Medication Sig Start Date End Date Taking? Authorizing Provider  gabapentin (NEURONTIN) 300 MG capsule Take 300 mg by mouth 3 (three) times daily.    Historical Provider, MD  ibuprofen (ADVIL,MOTRIN) 200 MG tablet Take 800 mg by mouth every 6 (six) hours as needed for moderate pain.    Historical Provider, MD  ketorolac (TORADOL) 10 MG tablet Take 10 mg by mouth every 6 (six) hours as needed for moderate pain.    Historical Provider, MD  lidocaine (LIDODERM) 5 % Place 1 patch onto the skin every 12 (twelve) hours. Remove & Discard patch within 12 hours or as directed by MD    Historical Provider, MD  metaxalone (SKELAXIN) 800 MG tablet Take 800 mg by mouth 3 (three) times daily as needed for muscle spasms.     Historical Provider, MD  methylPREDNISolone (MEDROL) 4 MG tablet Take 4-24 mg by mouth daily. She took for 6 days and started it on 03/17/14. She took six tablets on day 1, five tablets on day 2, four tablets on day 3, three tablets on day 4, two tablets on day 5 and one tablet on day 6. She completed the regimen yesterday (03/22/14).    Historical Provider, MD  oxyCODONE-acetaminophen (PERCOCET) 5-325 MG per tablet Take 1 tablet by mouth  every 4 (four) hours as needed. 03/23/14   Toy Cookey, MD  oxyCODONE-acetaminophen (PERCOCET) 5-325 MG per tablet Take 1 tablet by mouth every 6 (six) hours as needed for moderate pain. 05/30/14   Elwin Mocha, MD  RASPBERRY KETONES PO Take 2 tablets by mouth every morning.    Historical Provider, MD  traMADol (ULTRAM) 50 MG tablet Take 50 mg by mouth every 6 (six) hours as needed for moderate pain.    Historical Provider, MD   BP 127/78 mmHg  Pulse 94  Temp(Src) 98.5 F (36.9 C) (Oral)  Resp 18  Ht  (1.702 m)  Wt 211 lb (95.709  kg)  BMI 33.04 kg/m2  SpO2 98% Physical Exam  Constitutional: She is oriented to person, place, and time. She appears well-developed and well-nourished.  HENT:  Head: Normocephalic and atraumatic.  Right Ear: External ear normal.  Left Ear: External ear normal.  Nose: Nose normal.  Eyes: Right eye exhibits no discharge. Left eye exhibits no discharge.  Neck: Normal range of motion. Neck supple. No spinous process tenderness and no muscular tenderness present. No rigidity.  Cardiovascular: Normal rate, regular rhythm and normal heart sounds.   Pulmonary/Chest: Effort normal and breath sounds normal.  Abdominal: Soft. There is no tenderness.  Neurological: She is alert and oriented to person, place, and time.  Normal strength and gross sensation in all 4 extremities  Skin: Skin is warm and dry.  Psychiatric: Her affect is blunt. She expresses no homicidal and no suicidal ideation.  Flat affect  Vitals reviewed.   ED Course  Procedures (including critical care time) Labs Review Labs Reviewed  COMPREHENSIVE METABOLIC PANEL - Abnormal; Notable for the following:    Glucose, Bld 100 (*)    Anion gap 4 (*)    All other components within normal limits  CBC WITH DIFFERENTIAL/PLATELET    Imaging Review No results found.   EKG Interpretation None      MDM   Final diagnoses:  Medication reaction, initial encounter  Chronic neck pain    Patient's symptoms appear to be a new adverse reaction to her risperdal. Given she doesn't have symptoms with lower doses (2 mg), will recommend decrease in dose versus stopping altogether. Will need to consult her psychiatrist as soon as possible as an outpatient. Patient is not actively hallucinating and has no evidence of acute psychosis at this time. Her neck pain appears to be chronic in nature and with no new neurologic symptoms I do not feel she needs any acute imaging. Lab work is unremarkable for any side effects of risperdal. Discussed  strict return precautions.    Audree Camel, MD 01/27/15 [phone]

## 2016-01-07 ENCOUNTER — Encounter (HOSPITAL_BASED_OUTPATIENT_CLINIC_OR_DEPARTMENT_OTHER): Payer: Self-pay | Admitting: *Deleted

## 2016-01-07 ENCOUNTER — Emergency Department (HOSPITAL_BASED_OUTPATIENT_CLINIC_OR_DEPARTMENT_OTHER)
Admission: EM | Admit: 2016-01-07 | Discharge: 2016-01-07 | Disposition: A | Discharge status: Home / Self Care | Payer: 59 | Attending: Emergency Medicine | Admitting: Emergency Medicine

## 2016-01-07 DIAGNOSIS — R110 Nausea: Secondary | ICD-10-CM | POA: Diagnosis not present

## 2016-01-07 DIAGNOSIS — Z79899 Other long term (current) drug therapy: Secondary | ICD-10-CM | POA: Insufficient documentation

## 2016-01-07 DIAGNOSIS — R030 Elevated blood-pressure reading, without diagnosis of hypertension: Secondary | ICD-10-CM | POA: Insufficient documentation

## 2016-01-07 DIAGNOSIS — Z9889 Other specified postprocedural states: Secondary | ICD-10-CM | POA: Diagnosis not present

## 2016-01-07 DIAGNOSIS — F17210 Nicotine dependence, cigarettes, uncomplicated: Secondary | ICD-10-CM | POA: Insufficient documentation

## 2016-01-07 DIAGNOSIS — H53149 Visual discomfort, unspecified: Secondary | ICD-10-CM | POA: Insufficient documentation

## 2016-01-07 DIAGNOSIS — G8929 Other chronic pain: Secondary | ICD-10-CM | POA: Diagnosis not present

## 2016-01-07 DIAGNOSIS — M542 Cervicalgia: Secondary | ICD-10-CM | POA: Insufficient documentation

## 2016-01-07 DIAGNOSIS — R531 Weakness: Secondary | ICD-10-CM | POA: Diagnosis not present

## 2016-01-07 DIAGNOSIS — R51 Headache: Secondary | ICD-10-CM | POA: Diagnosis not present

## 2016-01-07 MED ORDER — HYDROCODONE-ACETAMINOPHEN 5-325 MG PO TABS: 1.0000 | ORAL_TABLET | Freq: Four times a day (QID) | ORAL | Status: DC | PRN

## 2016-01-07 MED ORDER — KETOROLAC TROMETHAMINE 60 MG/2ML IM SOLN
60.0000 mg | Freq: Once | INTRAMUSCULAR | Status: AC
  Administered 2016-01-07: 60 mg via INTRAMUSCULAR
  Filled 2016-01-07: qty 2

## 2016-01-07 MED ORDER — CYCLOBENZAPRINE HCL 10 MG PO TABS: 10.0000 mg | ORAL_TABLET | Freq: Three times a day (TID) | ORAL | Status: DC | PRN

## 2016-01-07 MED ORDER — HYDROMORPHONE HCL 1 MG/ML IJ SOLN
1.0000 mg | Freq: Once | INTRAMUSCULAR | Status: AC
  Administered 2016-01-07: 1 mg via INTRAMUSCULAR
  Filled 2016-01-07: qty 1

## 2016-01-07 MED ORDER — PREDNISONE 50 MG PO TABS: 50.0000 mg | ORAL_TABLET | Freq: Every day | ORAL | Status: DC

## 2016-01-07 MED FILL — predniSONE 50 MG TABS: 50 | 5 days supply | Qty: 5 | Fill #0

## 2016-01-07 MED FILL — CYCLOBENZAPRINE 10 MG TAB: 10 | 5 days supply | Qty: 15 | Fill #0

## 2016-01-07 MED FILL — HYDROCODON-APAP 5-325: 5-325 | 3 days supply | Qty: 15 | Fill #0

## 2016-01-07 NOTE — ED Notes (Signed)
Neck pain x 2 weeks. No injury. States she is being evaluated for possible pituitary tumor.

## 2016-01-07 NOTE — ED Notes (Signed)
Pt directed to pharmacy to pick up prescriptions. Ambulatory with steady gait to discharge window. Pt states she has called her father to pick her. Advised she should not drive for 4-6 hours due to receiving dilaudid

## 2016-01-07 NOTE — Discharge Instructions (Signed)
Return here as needed.  Follow-up with the clinic provided.  °

## 2016-01-07 NOTE — ED Notes (Signed)
Pain medications administered. Pt states her father is driving her home

## 2016-01-07 NOTE — ED Provider Notes (Signed)
CSN: [id number]     Arrival date & time 01/07/16  1134 History   First MD Initiated Contact with Patient 01/07/16 1204     Chief Complaint  Patient presents with  . Neck Pain     (Consider location/radiation/quality/duration/timing/severity/associated sxs/prior Treatment) HPI  Brittany Valdez is a 35 year old woman with a past medical history of chronic neck pain s/p C4 C5 fusion in 2014 who presents today to the ED with worsening posterior neck pain. Patient explains she is experiencing this burning and stabbing neck pain concomitantly occipital HA and retro-ocular pain constant for 2 weeks that is disturbing her sleep. She has tried heat on her neck with mild relief and 800 mg ibuprofen with no relief. Bending over, lifting and neck movement worsen her pain. Patient endorses nausea, photophobia, phonophobia, visual aura, elevated blood pressure, weak arms and brittle hair. She denies vomiting, fever, night sweats, chills, galactorrhea, CP, SOB, diarrhea and constipation.   Of note, patient went to a PCP in [place]  a month ago who said she may have a pituitary tumor as she has an elevated prolactin. Provider wrote her a script for an MRI that she did not have done as she had no insurance.  Past Medical History  Diagnosis Date  . Chronic neck pain    Past Surgical History  Procedure Laterality Date  . Cervical spine surgery    . Tubal ligation     No family history on file. Social History  Substance Use Topics  . Smoking status: Current Every Day Smoker -- 0.50 packs/day    Types: Cigarettes  . Smokeless tobacco: None  . Alcohol Use: No   OB History    No data available     Review of Systems See HPI   Allergies  Review of patient's allergies indicates no known allergies.  Home Medications   Prior to Admission medications   Medication Sig Start Date End Date Taking? Authorizing Provider  gabapentin (NEURONTIN) 300 MG capsule Take 300 mg by mouth 3 (three) times daily.     Historical Provider, MD  ibuprofen (ADVIL,MOTRIN) 200 MG tablet Take 800 mg by mouth every 6 (six) hours as needed for moderate pain.    Historical Provider, MD  ketorolac (TORADOL) 10 MG tablet Take 10 mg by mouth every 6 (six) hours as needed for moderate pain.    Historical Provider, MD  lidocaine (LIDODERM) 5 % Place 1 patch onto the skin every 12 (twelve) hours. Remove & Discard patch within 12 hours or as directed by MD    Historical Provider, MD  metaxalone (SKELAXIN) 800 MG tablet Take 800 mg by mouth 3 (three) times daily as needed for muscle spasms.     Historical Provider, MD  methylPREDNISolone (MEDROL) 4 MG tablet Take 4-24 mg by mouth daily. She took for 6 days and started it on 03/17/14. She took six tablets on day 1, five tablets on day 2, four tablets on day 3, three tablets on day 4, two tablets on day 5 and one tablet on day 6. She completed the regimen yesterday (03/22/14).    Historical Provider, MD  oxyCODONE-acetaminophen (PERCOCET) 5-325 MG per tablet Take 1 tablet by mouth every 4 (four) hours as needed. 03/23/14   Toy Cookey, MD  oxyCODONE-acetaminophen (PERCOCET) 5-325 MG per tablet Take 1 tablet by mouth every 6 (six) hours as needed for moderate pain. 05/30/14   Elwin Mocha, MD  RASPBERRY KETONES PO Take 2 tablets by mouth every morning.  Historical Provider, MD  traMADol (ULTRAM) 50 MG tablet Take 50 mg by mouth every 6 (six) hours as needed for moderate pain.    Historical Provider, MD   BP 163/93 mmHg  Pulse 87  Temp(Src) 98 F (36.7 C) (Oral)  Resp 20  Ht  (1.702 m)  Wt 95.709 kg  BMI 33.04 kg/m2  SpO2 100%  LMP 12/24/2015 Physical Exam  Constitutional: She is oriented to person, place, and time. She appears well-developed and well-nourished.  Sitting up in bed with eyes closed  HENT:  Head: Normocephalic.  Mouth/Throat: Oropharynx is clear and moist.  TTP  Eyes: Conjunctivae are normal.  Neck: No tracheal deviation present.  Cardiovascular: Normal  rate, regular rhythm, normal heart sounds and intact distal pulses.   Pulmonary/Chest: Effort normal and breath sounds normal.  Abdominal: Soft. Bowel sounds are normal. She exhibits no distension. There is no tenderness.  Musculoskeletal: She exhibits no edema or tenderness.  Decreased ROM in bilateral upper extremities. Unable to move neck in any direction due to pain  Lymphadenopathy:    She has no cervical adenopathy.  Neurological: She is alert and oriented to person, place, and time. No sensory deficit.  CN II-X, XII intact. CN XI difficult to assess as patient was in too much pain. Pain with superior medial gaze. Kernig negative  Skin: Skin is warm and dry.    ED Course  Procedures (including critical care time) Labs Review Labs Reviewed - No data to display  Imaging Review No results found. I have personally reviewed and evaluated these images and lab results as part of my medical decision-making.   Patient be treated for her chronic pain and referred to primary care Dr. told to return here as needed.  Patient has no neurological deficits noted on exam   Charlestine Night, PA-C 01/07/16 2156  Loren Racer, MD 01/10/16 [phone]

## 2019-12-07 ENCOUNTER — Inpatient Hospital Stay (HOSPITAL_COMMUNITY)
Admission: AD | Admit: 2019-12-07 | Discharge: 2019-12-14 | DRG: 885 | Disposition: A | Discharge status: Home / Self Care | Payer: Medicare HMO | Source: Other Acute Inpatient Hospital | Attending: Psychiatry | Admitting: Psychiatry

## 2019-12-07 DIAGNOSIS — F250 Schizoaffective disorder, bipolar type: Secondary | ICD-10-CM | POA: Diagnosis present

## 2019-12-07 DIAGNOSIS — F419 Anxiety disorder, unspecified: Secondary | ICD-10-CM | POA: Diagnosis present

## 2019-12-07 DIAGNOSIS — Z79899 Other long term (current) drug therapy: Secondary | ICD-10-CM | POA: Diagnosis not present

## 2019-12-07 DIAGNOSIS — G4700 Insomnia, unspecified: Secondary | ICD-10-CM | POA: Diagnosis present

## 2019-12-07 DIAGNOSIS — F15250 Other stimulant dependence with stimulant-induced psychotic disorder with delusions: Secondary | ICD-10-CM | POA: Diagnosis present

## 2019-12-07 DIAGNOSIS — F1120 Opioid dependence, uncomplicated: Secondary | ICD-10-CM | POA: Diagnosis present

## 2019-12-07 DIAGNOSIS — H6692 Otitis media, unspecified, left ear: Secondary | ICD-10-CM | POA: Diagnosis present

## 2019-12-07 DIAGNOSIS — Z23 Encounter for immunization: Secondary | ICD-10-CM | POA: Diagnosis not present

## 2019-12-07 DIAGNOSIS — F17210 Nicotine dependence, cigarettes, uncomplicated: Secondary | ICD-10-CM | POA: Diagnosis present

## 2019-12-07 NOTE — BH Assessment (Signed)
Tele Assessment Note  *Assessment entered into Epic by TTS counselor, AD *    Patient Name: Brittany ScalesMelissa Beeck MRN: [id number] Referring Physician: Prescott Gumsborne, Jody Neil Location of Patient: [facility] IP ADULT Location of Provider: Behavioral Health TTS Department  Brittany ScalesMelissa Wich is an 38 y.o. female.     Home Medications:  Home Medications  See Comment  12/07/19 [Last Taken Unknown]   Living Arrangement: with Friends Involuntary Commitment During Stay: No  - HPI/DSM Symptoms/History Chief Complaint (why are you here?):   Patient is 38 year old female [facility] after being seen standing outside in the cold and rain rambling about her kids, houses, and about someone stealing her cards. Patient has a prior mental health history, however, she has not been seen in [facility] for several years. During assessment patient pleasant displaying rapid mood swings as she talked to others in the room who was not present. Report, "there are 19 to 20 other people in this room. When you diagnosis me be sure to diagnosis them also because I am mentally stable." Report she has already by diagnosed by the scientist in different countries. Patient denies suicidal / homicidal ideations. Patient also speaks of some type of program that she is in that tries to read her body and sole of affliction and those who afflicted her. Medication Compliance: N/A (unsure if patient is on medication (unable to access)) Reason for seeking treatment: Other ([facility]) Presented With: Reports: Bizarre Behavior (Patient witnessed talking to herself and displaying bizarre behavior, rapid mood changes during the assessment.) Attitude: Bizarre Mood: Calm (patient present in a pleasant mood, however, she display bizarre behavior and displayed rapid mood changes.) Affect: Incongruent Insight: Poor Judgement: Poor Depressive Symptoms: Reports: Other (Unable to access) Anxiety  Symptoms: Reports: Other (Unable to access) Manic/Hypomanic Symptoms: Reports: Mood Swings (rapid mood swings witnessed by TTS assessor during assessment), Other (unable to access,) Delusion Description: Reports: Present, Bizarre Suicidal Intent: Reports: None Suicidal Plan: Reports: None Homicidal Ideation: No (patient denied ) Does patient have access to weapons?: No Criminal charges pending: No Court Date (if yes when): No Hallucination Type: Reports: Visual, Auditory History of: Reports: Schizophrenia (Suspected), Bipolar (Suspected) History of Abuse: No: Hx Substance Use Disorder Able to Care for Self: No Able to Control Self: No  - Medical History Psychological History: Reports: Depression.  Denies: Substance Use Disorder Social History: Reports: Tobacco Use in the Last 30 Days.  Denies: Substance Use Disorder Surgical History: Reports: Hysterectomy  - Disposition and Plan Diagnosis - Patient Problems:  Current Active Problems  Acute psychosis (Acute) F23   Does patient meet inpatient criteria for hospital admission?: Yes Does the patient meet criteria for Involuntary Commitment?: Yes Recommend /or Refer: Inpatient Therapy Denzil Magnuson(LaShunda Thomas, NP, recommend inpatient treatment) Action/Disposition Plan:  Disposition:   Denzil MagnusonLaShunda Thomas, NP, recommend inpatient treatment    Diagnosis: Acute Psychosis  Past Medical History:  Past Medical History:  Diagnosis Date  . Chronic neck pain     Past Surgical History:  Procedure Laterality Date  . CERVICAL SPINE SURGERY    . TUBAL LIGATION      Family History: No family history on file.  Social History:  reports that she has been smoking cigarettes. She has been smoking about 0.50 packs per day. She does not have any smokeless tobacco history on file. She reports that she does not drink alcohol or use drugs.  Additional Social History:  Alcohol / Drug Use Pain Medications:  See MAR Prescriptions: See MAR Over the  Counter: See MAR History of alcohol / drug use?: Yes Substance #1 Name of Substance 1: Amphetamine 1 - Age of First Use: unknown 1 - Amount (size/oz): unknown 1 - Frequency: unknown 1 - Duration: unknown 1 - Last Use / Amount: unknown, labs positive on arrival to ED  CIWA:   COWS:    Allergies: No Known Allergies  Home Medications:  No medications prior to admission.    OB/GYN Status:  No LMP recorded.  General Assessment Data Location of Assessment: [facility] TTS Assessment: Out of system Is this a Tele or Face-to-Face Assessment?: Tele Assessment Is this an Initial Assessment or a Re-assessment for this encounter?: Initial Assessment Patient Accompanied by:: N/A Language Other than English: No Living Arrangements: Other (Comment) What gender do you identify as?: Female Marital status: Married Pregnancy Status: No Living Arrangements: Spouse/significant other Can pt return to current living arrangement?: Yes Admission Status: Involuntary Petitioner: Police Is patient capable of signing voluntary admission?: No Referral Source: Other(police) Insurance type: [facility]      Crisis Care Plan Living Arrangements: Spouse/significant other Name of Psychiatrist: UTA due to not being reported on assessment Name of Therapist: UTA due to not being reported on assessment  Education Status Is patient currently in school?: (UTA due to not being reported on assessment)  Risk to self with the past 6 months Suicidal Ideation: No Has patient been a risk to self within the past 6 months prior to admission? : No Suicidal Intent: No Has patient had any suicidal intent within the past 6 months prior to admission? : No Is patient at risk for suicide?: No Suicidal Plan?: No Has patient had any suicidal plan within the past 6 months prior to admission? : No Access to Means: No What has been your use of drugs/alcohol within the last 12 months?: LABS POSITIVE FOR  AMPHETAMINE Previous Attempts/Gestures: (UTA due to not being reported on assessment) Triggers for Past Attempts: (UTA due to not being reported on assessment) Intentional Self Injurious Behavior: (UTA due to not being reported on assessment) Family Suicide History: Unable to assess Recent stressful life event(s): Other (Comment)(PSYCHOSIS) Persecutory voices/beliefs?: Yes Depression: (UTA due to AMS) Substance abuse history and/or treatment for substance abuse?: Yes(PER CHART REVIEW) Suicide prevention information given to non-admitted patients: Not applicable  Risk to Others within the past 6 months Homicidal Ideation: No Does patient have any lifetime risk of violence toward others beyond the six months prior to admission? : No Thoughts of Harm to Others: No Current Homicidal Intent: No Current Homicidal Plan: No Access to Homicidal Means: No History of harm to others?: No Assessment of Violence: None Noted Does patient have access to weapons?: No Criminal Charges Pending?: No Does patient have a court date: No Is patient on probation?: No  Psychosis Hallucinations: Auditory, Visual Delusions: Unspecified, Grandiose  Mental Status Report Appearance/Hygiene: Disheveled, Bizarre Eye Contact: Unable to Assess Motor Activity: Restlessness Speech: Rapid, Pressured, Tangential Level of Consciousness: Restless Mood: Anxious, Preoccupied Affect: Anxious, Preoccupied, Labile Anxiety Level: Severe Thought Processes: Flight of Ideas, Circumstantial, Tangential Judgement: Impaired Orientation: Unable to assess Obsessive Compulsive Thoughts/Behaviors: Unable to Assess  Cognitive Functioning Concentration: Unable to Assess Memory: Unable to Assess Is patient IDD: No Insight: Poor Impulse Control: Poor Appetite: (UTA due to not being reported on assessment) Have you had any weight changes? : (UTA due to not being reported on assessment) Sleep: Unable to Assess Vegetative  Symptoms: Unable to Assess  ADLScreening (  [facility]) Patient's cognitive ability adequate to safely complete daily activities?: Yes Patient able to express need for assistance with ADLs?: Yes Independently performs ADLs?: Yes (appropriate for developmental age)  Prior Inpatient Therapy Prior Inpatient Therapy: (UTA due to not being reported on assessment)  Prior Outpatient Therapy Prior Outpatient Therapy: (UTA due to not being reported on assessment)  ADL Screening (condition at time of admission) Patient's cognitive ability adequate to safely complete daily activities?: Yes Is the patient deaf or have difficulty hearing?: No Does the patient have difficulty seeing, even when wearing glasses/contacts?: No Does the patient have difficulty concentrating, remembering, or making decisions?: Yes Patient able to express need for assistance with ADLs?: Yes Does the patient have difficulty dressing or bathing?: No Independently performs ADLs?: Yes (appropriate for developmental age) Does the patient have difficulty walking or climbing stairs?: No Weakness of Legs: None Weakness of Arms/Hands: None  Home Assistive Devices/Equipment Home Assistive Devices/Equipment: None    Abuse/Neglect Assessment (Assessment to be complete while patient is alone) Abuse/Neglect Assessment Can Be Completed: Unable to assess, patient is non-responsive or altered mental status     Advance Directives (For Healthcare) Does Patient Have a Medical Advance Directive?: Unable to assess, patient is non-responsive or altered mental status          Disposition:  Disposition Initial Assessment Completed for this Encounter: Yes Disposition of Patient: Admit Type of inpatient treatment program: Adult Patient refused recommended treatment: No  This service was provided via telemedicine using a 2-way, interactive audio and video technology.  Names of all persons participating in this  telemedicine service and their role in this encounter. Name: Jaina Morin Role: Patient  Name: KD Role: TTS          Karolee Ohs 12/07/2019 10:31 PM

## 2019-12-08 ENCOUNTER — Encounter (HOSPITAL_COMMUNITY): Payer: Self-pay | Admitting: Family

## 2019-12-08 ENCOUNTER — Other Ambulatory Visit: Payer: Self-pay

## 2019-12-08 DIAGNOSIS — F250 Schizoaffective disorder, bipolar type: Secondary | ICD-10-CM

## 2019-12-08 MED ORDER — INFLUENZA VAC SPLIT QUAD 0.5 ML IM SUSY
0.5000 mL | PREFILLED_SYRINGE | INTRAMUSCULAR | Status: AC
  Administered 2019-12-09: 0.5 mL via INTRAMUSCULAR
  Filled 2019-12-08: qty 0.5

## 2019-12-08 MED ORDER — LORAZEPAM 2 MG/ML IJ SOLN: 2.0000 mg | Freq: Four times a day (QID) | INTRAMUSCULAR | Status: DC | PRN

## 2019-12-08 MED ORDER — GABAPENTIN 300 MG PO CAPS
300.0000 mg | ORAL_CAPSULE | Freq: Three times a day (TID) | ORAL | Status: DC
  Administered 2019-12-08 – 2019-12-14 (×18): 300 mg via ORAL
  Filled 2019-12-08 (×25): qty 1

## 2019-12-08 MED ORDER — MAGNESIUM HYDROXIDE 400 MG/5ML PO SUSP: 30.0000 mL | Freq: Every day | ORAL | Status: DC | PRN

## 2019-12-08 MED ORDER — CLONIDINE HCL 0.2 MG PO TABS
0.2000 mg | ORAL_TABLET | Freq: Two times a day (BID) | ORAL | Status: DC
  Administered 2019-12-08: 10:00:00 0.2 mg via ORAL
  Filled 2019-12-08: qty 1
  Filled 2019-12-08: qty 2
  Filled 2019-12-08 (×2): qty 1

## 2019-12-08 MED ORDER — ACETAMINOPHEN 325 MG PO TABS
650.0000 mg | ORAL_TABLET | Freq: Four times a day (QID) | ORAL | Status: DC | PRN
  Administered 2019-12-08 – 2019-12-09 (×2): 650 mg via ORAL
  Filled 2019-12-08 (×3): qty 2

## 2019-12-08 MED ORDER — PNEUMOCOCCAL VAC POLYVALENT 25 MCG/0.5ML IJ INJ
0.5000 mL | INJECTION | INTRAMUSCULAR | Status: AC
  Administered 2019-12-09: 0.5 mL via INTRAMUSCULAR

## 2019-12-08 MED ORDER — ALUM & MAG HYDROXIDE-SIMETH 200-200-20 MG/5ML PO SUSP: 30.0000 mL | ORAL | Status: DC | PRN

## 2019-12-08 MED ORDER — HYDROXYZINE HCL 25 MG PO TABS
25.0000 mg | ORAL_TABLET | Freq: Three times a day (TID) | ORAL | Status: DC | PRN
  Administered 2019-12-08 – 2019-12-14 (×6): 25 mg via ORAL
  Filled 2019-12-08 (×6): qty 1

## 2019-12-08 MED ORDER — NICOTINE 21 MG/24HR TD PT24
21.0000 mg | MEDICATED_PATCH | Freq: Every day | TRANSDERMAL | Status: DC
  Filled 2019-12-08: qty 1

## 2019-12-08 MED ORDER — CLONIDINE HCL 0.1 MG PO TABS: 0.2000 mg | ORAL_TABLET | ORAL | Status: DC | PRN

## 2019-12-08 MED ORDER — LORAZEPAM 1 MG PO TABS
1.0000 mg | ORAL_TABLET | Freq: Four times a day (QID) | ORAL | Status: DC | PRN
  Administered 2019-12-09: 11:00:00 1 mg via ORAL
  Filled 2019-12-08 (×2): qty 1

## 2019-12-08 MED ORDER — TRAZODONE HCL 50 MG PO TABS
50.0000 mg | ORAL_TABLET | Freq: Every evening | ORAL | Status: DC | PRN
  Administered 2019-12-08: 50 mg via ORAL
  Filled 2019-12-08: qty 1

## 2019-12-08 MED ORDER — TEMAZEPAM 30 MG PO CAPS
30.0000 mg | ORAL_CAPSULE | Freq: Every day | ORAL | Status: DC
  Administered 2019-12-09 – 2019-12-11 (×3): 30 mg via ORAL
  Filled 2019-12-08 (×5): qty 1

## 2019-12-08 MED ORDER — NICOTINE 14 MG/24HR TD PT24
14.0000 mg | MEDICATED_PATCH | Freq: Every day | TRANSDERMAL | Status: DC
  Administered 2019-12-08 – 2019-12-14 (×7): 14 mg via TRANSDERMAL
  Filled 2019-12-08 (×9): qty 1

## 2019-12-08 MED ORDER — CARBAMAZEPINE 100 MG PO CHEW
100.0000 mg | CHEWABLE_TABLET | Freq: Three times a day (TID) | ORAL | Status: DC
  Administered 2019-12-08 – 2019-12-09 (×3): 100 mg via ORAL
  Filled 2019-12-08 (×7): qty 1

## 2019-12-08 MED ORDER — RISPERIDONE 3 MG PO TABS
3.0000 mg | ORAL_TABLET | Freq: Two times a day (BID) | ORAL | Status: DC
  Administered 2019-12-08 – 2019-12-09 (×3): 3 mg via ORAL
  Filled 2019-12-08 (×6): qty 1

## 2019-12-08 MED ORDER — BENZTROPINE MESYLATE 0.5 MG PO TABS
0.5000 mg | ORAL_TABLET | Freq: Two times a day (BID) | ORAL | Status: DC
  Administered 2019-12-08 – 2019-12-14 (×13): 0.5 mg via ORAL
  Filled 2019-12-08 (×18): qty 1

## 2019-12-08 NOTE — Progress Notes (Deleted)
[place] NOVEL CORONAVIRUS (COVID-19) DAILY CHECK-OFF SYMPTOMS - answer yes or no to each - every day NO YES  Have you had a fever in the past 24 hours?  . Fever (Temp > 37.80C / 100F) X   Have you had any of these symptoms in the past 24 hours? . New Cough .  Sore Throat  .  Shortness of Breath .  Difficulty Breathing .  Unexplained Body Aches   X   Have you had any one of these symptoms in the past 24 hours not related to allergies?   . Runny Nose .  Nasal Congestion .  Sneezing   X   If you have had runny nose, nasal congestion, sneezing in the past 24 hours, has it worsened?  X   EXPOSURES - check yes or no X   Have you traveled outside the state in the past 14 days?  X   Have you been in contact with someone with a confirmed diagnosis of COVID-19 or PUI in the past 14 days without wearing appropriate PPE?  X   Have you been living in the same home as a person with confirmed diagnosis of COVID-19 or a PUI (household contact)?    X   Have you been diagnosed with COVID-19?    X              What to do next: Answered NO to all: Answered YES to anything:   Proceed with unit schedule Follow the [facility] Inpatient Flowsheet.   

## 2019-12-08 NOTE — Plan of Care (Signed)
  Problem: Activity: Goal: Sleeping patterns will improve Outcome: Progressing   Problem: Safety: Goal: Periods of time without injury will increase Outcome: Progressing   Problem: Coping: Goal: Ability to use eye contact when communicating with others will improve Outcome: Progressing
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
## 2019-12-08 NOTE — Tx Team (Signed)
Interdisciplinary Treatment and Diagnostic Plan Update  12/08/2019 Time of Session: 10:00am Brittany Valdez MRN: [id number]  Principal Diagnosis: <principal problem not specified>  Secondary Diagnoses: Active Problems:   Schizoaffective disorder, bipolar type ([place])   Current Medications:  Current Facility-Administered Medications  Medication Dose Route Frequency Provider Last Rate Last Admin  . acetaminophen (TYLENOL) tablet 650 mg  650 mg Oral Q6H PRN Emmaline Kluver, FNP   650 mg at 12/08/19 0101  . alum & mag hydroxide-simeth (MAALOX/MYLANTA) 200-200-20 MG/5ML suspension 30 mL  30 mL Oral Q4H PRN Emmaline Kluver, FNP      . benztropine (COGENTIN) tablet 0.5 mg  0.5 mg Oral BID Johnn Hai, MD   0.5 mg at 12/08/19 1004  . carbamazepine (TEGRETOL) chewable tablet 100 mg  100 mg Oral TID Johnn Hai, MD      . cloNIDine (CATAPRES) tablet 0.2 mg  0.2 mg Oral Q4H PRN Johnn Hai, MD      . gabapentin (NEURONTIN) capsule 300 mg  300 mg Oral TID Johnn Hai, MD      . hydrOXYzine (ATARAX/VISTARIL) tablet 25 mg  25 mg Oral TID PRN Emmaline Kluver, FNP   25 mg at 12/08/19 0101  . [START ON 12/09/2019] influenza vac split quadrivalent PF (FLUARIX) injection 0.5 mL  0.5 mL Intramuscular Tomorrow-1000 Johnn Hai, MD      . LORazepam (ATIVAN) tablet 1 mg  1 mg Oral Q6H PRN Johnn Hai, MD       Or  . LORazepam (ATIVAN) injection 2 mg  2 mg Intramuscular Q6H PRN Johnn Hai, MD      . magnesium hydroxide (MILK OF MAGNESIA) suspension 30 mL  30 mL Oral Daily PRN Emmaline Kluver, FNP      . nicotine (NICODERM CQ - dosed in mg/24 hours) patch 14 mg  14 mg Transdermal Daily Anike, Adaku C, NP   14 mg at 12/08/19 1010  . [START ON 12/09/2019] pneumococcal 23 valent vaccine (PNEUMOVAX-23) injection 0.5 mL  0.5 mL Intramuscular Tomorrow-1000 Johnn Hai, MD      . risperiDONE (RISPERDAL) tablet 3 mg  3 mg Oral BID Johnn Hai, MD   3 mg at 12/08/19 1004  . temazepam (RESTORIL) capsule 30 mg  30 mg Oral QHS  Johnn Hai, MD      . traZODone (DESYREL) tablet 50 mg  50 mg Oral QHS PRN Anike, Adaku C, NP   50 mg at 12/08/19 0101   PTA Medications: Medications Prior to Admission  Medication Sig Dispense Refill Last Dose  . cyclobenzaprine (FLEXERIL) 10 MG tablet Take 1 tablet (10 mg total) by mouth 3 (three) times daily as needed for muscle spasms. 15 tablet 0   . gabapentin (NEURONTIN) 300 MG capsule Take 300 mg by mouth 3 (three) times daily.     Marland Kitchen HYDROcodone-acetaminophen (NORCO/VICODIN) 5-325 MG tablet Take 1 tablet by mouth every 6 (six) hours as needed for moderate pain. 15 tablet 0   . ibuprofen (ADVIL,MOTRIN) 200 MG tablet Take 800 mg by mouth every 6 (six) hours as needed for moderate pain.     Marland Kitchen ketorolac (TORADOL) 10 MG tablet Take 10 mg by mouth every 6 (six) hours as needed for moderate pain.     Marland Kitchen lidocaine (LIDODERM) 5 % Place 1 patch onto the skin every 12 (twelve) hours. Remove & Discard patch within 12 hours or as directed by MD     . metaxalone (SKELAXIN) 800 MG tablet Take 800 mg by mouth 3 (  three) times daily as needed for muscle spasms.      . methylPREDNISolone (MEDROL) 4 MG tablet Take 4-24 mg by mouth daily. She took for 6 days and started it on 03/17/14. She took six tablets on day 1, five tablets on day 2, four tablets on day 3, three tablets on day 4, two tablets on day 5 and one tablet on day 6. She completed the regimen yesterday (03/22/14).     Marland Kitchen oxyCODONE-acetaminophen (PERCOCET) 5-325 MG per tablet Take 1 tablet by mouth every 4 (four) hours as needed. 15 tablet 0   . oxyCODONE-acetaminophen (PERCOCET) 5-325 MG per tablet Take 1 tablet by mouth every 6 (six) hours as needed for moderate pain. 30 tablet 0   . predniSONE (DELTASONE) 50 MG tablet Take 1 tablet (50 mg total) by mouth daily with breakfast. 5 tablet 0   . RASPBERRY KETONES PO Take 2 tablets by mouth every morning.     . traMADol (ULTRAM) 50 MG tablet Take 50 mg by mouth every 6 (six) hours as needed for moderate  pain.       Patient Stressors: Financial difficulties Health problems  Patient Strengths: Average or above average intelligence Capable of independent living  Treatment Modalities: Medication Management, Group therapy, Case management,  1 to 1 session with clinician, Psychoeducation, Recreational therapy.   Physician Treatment Plan for Primary Diagnosis: <principal problem not specified> Long Term Goal(s): Improvement in symptoms so as ready for discharge Improvement in symptoms so as ready for discharge   Short Term Goals: Compliance with prescribed medications will improve Ability to identify and develop effective coping behaviors will improve  Medication Management: Evaluate patient's response, side effects, and tolerance of medication regimen.  Therapeutic Interventions: 1 to 1 sessions, Unit Group sessions and Medication administration.  Evaluation of Outcomes: Not Met  Physician Treatment Plan for Secondary Diagnosis: Active Problems:   Schizoaffective disorder, bipolar type ([place])  Long Term Goal(s): Improvement in symptoms so as ready for discharge Improvement in symptoms so as ready for discharge   Short Term Goals: Compliance with prescribed medications will improve Ability to identify and develop effective coping behaviors will improve     Medication Management: Evaluate patient's response, side effects, and tolerance of medication regimen.  Therapeutic Interventions: 1 to 1 sessions, Unit Group sessions and Medication administration.  Evaluation of Outcomes: Not Met   RN Treatment Plan for Primary Diagnosis: <principal problem not specified> Long Term Goal(s): Knowledge of disease and therapeutic regimen to maintain health will improve  Short Term Goals: Ability to demonstrate self-control, Ability to participate in decision making will improve, Ability to identify and develop effective coping behaviors will improve and Compliance with prescribed medications  will improve  Medication Management: RN will administer medications as ordered by provider, will assess and evaluate patient's response and provide education to patient for prescribed medication. RN will report any adverse and/or side effects to prescribing provider.  Therapeutic Interventions: 1 on 1 counseling sessions, Psychoeducation, Medication administration, Evaluate responses to treatment, Monitor vital signs and CBGs as ordered, Perform/monitor CIWA, COWS, AIMS and Fall Risk screenings as ordered, Perform wound care treatments as ordered.  Evaluation of Outcomes: Not Met   LCSW Treatment Plan for Primary Diagnosis: <principal problem not specified> Long Term Goal(s): Safe transition to appropriate next level of care at discharge, Engage patient in therapeutic group addressing interpersonal concerns.  Short Term Goals: Engage patient in aftercare planning with referrals and resources, Increase social support, Increase ability to appropriately verbalize feelings, Facilitate  acceptance of mental health diagnosis and concerns, Identify triggers associated with mental health/substance abuse issues and Increase skills for wellness and recovery  Therapeutic Interventions: Assess for all discharge needs, 1 to 1 time with Social worker, Explore available resources and support systems, Assess for adequacy in community support network, Educate family and significant other(s) on suicide prevention, Complete Psychosocial Assessment, Interpersonal group therapy.  Evaluation of Outcomes: Not Met  Progress in Treatment: Attending groups: No. New to unit. Participating in groups: No. Taking medication as prescribed: Yes. Toleration medication: Yes. Family/Significant other contact made: No, will contact:  supports if consents are granted. Patient understands diagnosis: No. Discussing patient identified problems/goals with staff: No. Medical problems stabilized or resolved: Yes. Denies  suicidal/homicidal ideation: Yes. Issues/concerns per patient self-inventory: No.  New problem(s) identified: No, Describe:  CSW continuing to assess.  New Short Term/Long Term Goal(s): detox, medication management for mood stabilization; elimination of SI thoughts; development of comprehensive mental wellness/sobriety plan.  Patient Goals:  Patient unable to articulate a goal.  Discharge Plan or Barriers: Patient newly admitted to unit, CSW assessing for appropriate referrals.  Reason for Continuation of Hospitalization: Anxiety Delusions  Hallucinations Medication stabilization Withdrawal symptoms  Estimated Length of Stay: 5-7 days  Attendees: Patient: 12/08/2019 10:29 AM  Physician: Dr.Farah 12/08/2019 10:29 AM  Nursing: Mateo Flow, RN 12/08/2019 10:29 AM  RN Care Manager: 12/08/2019 10:29 AM  Social Worker: Stephanie Acre, [place] 12/08/2019 10:29 AM  Recreational Therapist:  12/08/2019 10:29 AM  Other:  12/08/2019 10:29 AM  Other:  12/08/2019 10:29 AM  Other: 12/08/2019 10:29 AM    Scribe for Treatment Team: Joellen Jersey, [place] 12/08/2019 10:29 AM

## 2019-12-08 NOTE — Tx Team (Signed)
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  The patient and family have been given the opportunity to ask questions and make suggestions.  Lajoyce Corners, RN 12/08/2019, 1:37 AM

## 2019-12-08 NOTE — Progress Notes (Signed)
Recreation Therapy Notes  Date: 12.18.20 Time: 1015 Location: [address] Dayroom  Group Topic: Coping Skills  Goal Area(s) Addresses:  Patient will identify positive coping skills. Patient will identify benefit of using positive coping skills post d/c.  Intervention: Jenga  Activity: Coping Skills Jenga.  During each turn, patients were give a positive coping skill.  Patients could not repeat coping skills that had already been given.  Once the jenga tower falls over, the game would start over.  Education: Coping Skills, Discharge Planning.   Education Outcome: Acknowledges understanding/In group clarification offered/Needs additional education.   Clinical Observations/Feedback: Pt did not attend group session.     Brittany Valdez, LRT/CTRS         Brittany Valdez A 12/08/2019 11:28 AM 

## 2019-12-08 NOTE — Progress Notes (Signed)
[place] NOVEL CORONAVIRUS (COVID-19) DAILY CHECK-OFF SYMPTOMS - answer yes or no to each - every day NO YES  Have you had a fever in the past 24 hours?  . Fever (Temp > 37.80C / 100F) X   Have you had any of these symptoms in the past 24 hours? . New Cough .  Sore Throat  .  Shortness of Breath .  Difficulty Breathing .  Unexplained Body Aches   X   Have you had any one of these symptoms in the past 24 hours not related to allergies?   . Runny Nose .  Nasal Congestion .  Sneezing   X   If you have had runny nose, nasal congestion, sneezing in the past 24 hours, has it worsened?  X   EXPOSURES - check yes or no X   Have you traveled outside the state in the past 14 days?  X   Have you been in contact with someone with a confirmed diagnosis of COVID-19 or PUI in the past 14 days without wearing appropriate PPE?  X   Have you been living in the same home as a person with confirmed diagnosis of COVID-19 or a PUI (household contact)?    X   Have you been diagnosed with COVID-19?    X              What to do next: Answered NO to all: Answered YES to anything:   Proceed with unit schedule Follow the [facility] Inpatient Flowsheet.   

## 2019-12-08 NOTE — Progress Notes (Signed)
Spiritual care group facilitated by Chaplain Jerene Pitch, MDiv, [facility].  Group focused on topic of HOPE.  Patients engaged in facilitated dialog around topic, naming places of hope in their lives and creating a word cloud on the board.  Pt's engaged in visual explorer exercise, choosing art pieces that connect with hope for today.   Brittany Valdez was invited to group.  Did not attend.

## 2019-12-08 NOTE — [facility] Counselor (Signed)
CSW attempted to engage patient in PSA. Patient recently admitted and presents with disorganized thought content, she is not able to participate in an assessment at this time.  CSW will follow up at a later date.  Stephanie Acre, MSW, [place] Social Worker [facility]  [phone]

## 2019-12-08 NOTE — Progress Notes (Signed)
Patient ID: Brittany Valdez, female   DOB: 1981/09/05, 38 y.o.   MRN: [id number]  Admission Note:  D: Pt appeared disheveled and anxious during admission.  Pt  denies SI / AVH at this time. Patient responding to internal stimuli.Patient has passive HI saying she would harm people who are trying to take her money and her rights. Pt has no prior mental health history. Pt has no known drug allergies.  Pt presents with pressured rapid tangential speech and delusions. Pt is redirectable and cooperative with assessment.      A: Pt admitted to unit per protocol, skin assessment and search done and no contraband found.  Pt oriented to unit and nursing staff.    R: Pt was receptive to teaching.  15 min safety checks started. Writer offered support.

## 2019-12-08 NOTE — H&P (Signed)
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 Brittany Valdez is 38 years of age and she presented under petition for involuntary commitment.  She had presented initially to [facility] on 12/16, after she was found by police standing in the rain, in near freezing temperatures, and speaking in nonsensical and/or delusional statements.  Her drug screen is positive for amphetamines only.  The patient is an unreliable historian due to the acuteness of her psychosis.  She tells me she suffers from "inflictions" when I ask her why she is in the hospital, she further states she "lives with a scientist who gives her chemicals, they are legal, I have an IQ of 1651, I was in 3 countries in 6 [place] with Brittany Val[place], the meds he injects me with are legal" she then asks "what [place] are we in?" and when I explained to her we are in [place] she states she is in a [place] [facility]" but is unaware of day date and time.  She states she has been in "lots of hospitals" because of her "inflections and sex- people do things to me..." when asked if I can contact a family member or a spouse she states "we are not married, I do not have his number-I cannot remember numbers"  This mental status exam is consistent with the initial evaluation of 12/16 in which she was described as displaying "rapid tangential speech and speaking of others whom she is reluctant to name that are afflicting her" she also elaborated there is a scientist who lives in her home and "gives her medication that supposed to  clean her body and soul".  Our medical records reflect that she has had cervical spine surgery in 2015 with residual pain, and she was evaluated further in January 2017 at that point in time she was described as "recently diagnosed with schizophrenia" prescribed Risperdal at a very high dose of 8 mg a day and was complaining of side effects and mentioned [place] in [place] who was treating her but again other records are lost to our system, we have no collateral history, and the patient is an unreliable historian due to the acuteness of her psychosis.  At this point in time her differential is Axis I schizophrenia acute exacerbation complicated by methamphetamine abuse Vs: Methamphetamine induced psychosis Axis II Limited information to discern Axis III history of cervical disc disease and surgery, laboratory work nondiagnostic other than positive drug screen for amphetamines/ no known allergies according to records/ [facility]TC [facility] 12/16  Associated Signs/Symptoms: Depression Symptoms:  psychomotor agitation, (Hypo) Manic Symptoms:  Delusions, Distractibility, Flight of Ideas, Anxiety Symptoms:  n/a Psychotic Symptoms:  Delusions, PTSD Symptoms: ukn Total Time spent with patient: 45 minutes  Past Psychiatric History: History of being diagnosed with a schizophrenic condition  Is the patient at risk to self? Yes.    Has the patient been a risk to self in the past 6 months? No.  Has the patient been a risk to self within the distant past? No.  Is the patient a risk to others? No.  Has the patient been a risk to others in the past 6 months? No.  Has the patient been a risk to others within the distant past? No.   Prior Inpatient Therapy: Prior Inpatient Therapy: (UTA due to not being reported on assessment) Prior Outpatient Therapy: Prior Outpatient Therapy: (UTA due to not being reported on assessment)  Alcohol Screening: 1. How often do you have a  drink containing alcohol?: Never 2. How many drinks containing alcohol do you have on a typical day when you are drinking?: 1 or 2 3. How often do you have six or more drinks on one occasion?: Never AUDIT-C Score: 0 4. How often during the last year have you found that you were not able to stop drinking once you had started?: Never 5. How often during the last year have you failed to do what was normally expected from you becasue of drinking?: Never 6. How often during the last year have you needed a first drink in the morning to get yourself going after a heavy drinking session?: Never 7. How often during the last year have you had a feeling of guilt of remorse after drinking?: Never 8. How often during the last year have you been unable to remember what happened the night before because you had been drinking?: Never 9. Have you or someone else been injured as a result of your drinking?: No 10. Has a relative or friend or a doctor or another health worker been concerned about your drinking or suggested you cut down?: No Alcohol Use Disorder Identification Test Final Score (AUDIT): 0 Alcohol Brief Interventions/Follow-up: AUDIT Score <7 follow-up not indicated Substance Abuse History in the last 12 months:  Yes.   Consequences of Substance Abuse: Medical Consequences:  Possibly amphetamine induced psychosis Previous Psychotropic Medications: Yes  Psychological Evaluations: No  Past Medical History:  Past Medical History:  Diagnosis Date  . Chronic neck pain     Past Surgical History:  Procedure Laterality Date  . CERVICAL SPINE SURGERY    . TUBAL LIGATION     Family History: History reviewed. No pertinent family history. Family Psychiatric  History: Patient denies but is again a poor historian Tobacco Screening: Have you used any form of tobacco in the last 30 days? (Cigarettes, Smokeless Tobacco, Cigars, and/or Pipes): Yes Tobacco use, Select all that apply: 5 or more cigarettes per  day(per pt smokes 1 pack per day) Are you interested in Tobacco Cessation Medications?: No, patient refused Counseled patient on smoking cessation including recognizing danger situations, developing coping skills and basic information about quitting provided: Refused/Declined practical counseling Social History:  Social History   Substance and Sexual Activity  Alcohol Use No     Social History   Substance and Sexual Activity  Drug Use No    Additional Social History: Marital status: Married    Pain Medications: See MAR Prescriptions: See MAR Over the Counter: See MAR History of alcohol / drug use?: Yes Name of Substance 1: Amphetamine 1 - Age of First Use: unknown 1 - Amount (size/oz): unknown 1 - Frequency: unknown 1 - Duration: unknown 1 - Last Use / Amount: unknown, labs positive on arrival to ED                  Allergies:  No Known Allergies Lab Results: No results found for this or any previous visit (from the past 48 hour(s)).  Blood Alcohol level:  No results found for: [facility]  Metabolic Disorder Labs:  No results found for: HGBA1C, MPG No results found for: PROLACTIN No results found for: CHOL, TRIG,  HDL, CHOLHDL, VLDL, LDLCALC  Current Medications: Current Facility-Administered Medications  Medication Dose Route Frequency Provider Last Rate Last Admin  . acetaminophen (TYLENOL) tablet 650 mg  650 mg Oral Q6H PRN Patrcia Dolly, FNP   650 mg at 12/08/19 0101  . alum & mag hydroxide-simeth (MAALOX/MYLANTA) 200-200-20 MG/5ML suspension 30 mL  30 mL Oral Q4H PRN Patrcia Dolly, FNP      . benztropine (COGENTIN) tablet 0.5 mg  0.5 mg Oral BID Malvin Johns, MD   0.5 mg at 12/08/19 1004  . carbamazepine (TEGRETOL) chewable tablet 100 mg  100 mg Oral TID Malvin Johns, MD      . cloNIDine (CATAPRES) tablet 0.2 mg  0.2 mg Oral BID Malvin Johns, MD   0.2 mg at 12/08/19 1004  . gabapentin (NEURONTIN) capsule 300 mg  300 mg Oral TID Malvin Johns, MD      . hydrOXYzine  (ATARAX/VISTARIL) tablet 25 mg  25 mg Oral TID PRN Patrcia Dolly, FNP   25 mg at 12/08/19 0101  . [START ON 12/09/2019] influenza vac split quadrivalent PF (FLUARIX) injection 0.5 mL  0.5 mL Intramuscular Tomorrow-1000 Malvin Johns, MD      . LORazepam (ATIVAN) tablet 1 mg  1 mg Oral Q6H PRN Malvin Johns, MD       Or  . LORazepam (ATIVAN) injection 2 mg  2 mg Intramuscular Q6H PRN Malvin Johns, MD      . magnesium hydroxide (MILK OF MAGNESIA) suspension 30 mL  30 mL Oral Daily PRN Patrcia Dolly, FNP      . nicotine (NICODERM CQ - dosed in mg/24 hours) patch 14 mg  14 mg Transdermal Daily Anike, Adaku C, NP   14 mg at 12/08/19 1010  . [START ON 12/09/2019] pneumococcal 23 valent vaccine (PNEUMOVAX-23) injection 0.5 mL  0.5 mL Intramuscular Tomorrow-1000 Malvin Johns, MD      . risperiDONE (RISPERDAL) tablet 3 mg  3 mg Oral BID Malvin Johns, MD   3 mg at 12/08/19 1004  . temazepam (RESTORIL) capsule 30 mg  30 mg Oral QHS Malvin Johns, MD      . traZODone (DESYREL) tablet 50 mg  50 mg Oral QHS PRN Anike, Adaku C, NP   50 mg at 12/08/19 0101   PTA Medications: Medications Prior to Admission  Medication Sig Dispense Refill Last Dose  . cyclobenzaprine (FLEXERIL) 10 MG tablet Take 1 tablet (10 mg total) by mouth 3 (three) times daily as needed for muscle spasms. 15 tablet 0   . gabapentin (NEURONTIN) 300 MG capsule Take 300 mg by mouth 3 (three) times daily.     Marland Kitchen HYDROcodone-acetaminophen (NORCO/VICODIN) 5-325 MG tablet Take 1 tablet by mouth every 6 (six) hours as needed for moderate pain. 15 tablet 0   . ibuprofen (ADVIL,MOTRIN) 200 MG tablet Take 800 mg by mouth every 6 (six) hours as needed for moderate pain.     Marland Kitchen ketorolac (TORADOL) 10 MG tablet Take 10 mg by mouth every 6 (six) hours as needed for moderate pain.     Marland Kitchen lidocaine (LIDODERM) 5 % Place 1 patch onto the skin every 12 (twelve) hours. Remove & Discard patch within 12 hours or as directed by MD     . metaxalone (SKELAXIN) 800 MG tablet  Take 800 mg by mouth 3 (three) times daily as needed for muscle spasms.      . methylPREDNISolone (MEDROL) 4 MG tablet Take 4-24 mg by mouth daily. She took for 6 days  and started it on 03/17/14. She took six tablets on day 1, five tablets on day 2, four tablets on day 3, three tablets on day 4, two tablets on day 5 and one tablet on day 6. She completed the regimen yesterday (03/22/14).     Marland Kitchen oxyCODONE-acetaminophen (PERCOCET) 5-325 MG per tablet Take 1 tablet by mouth every 4 (four) hours as needed. 15 tablet 0   . oxyCODONE-acetaminophen (PERCOCET) 5-325 MG per tablet Take 1 tablet by mouth every 6 (six) hours as needed for moderate pain. 30 tablet 0   . predniSONE (DELTASONE) 50 MG tablet Take 1 tablet (50 mg total) by mouth daily with breakfast. 5 tablet 0   . RASPBERRY KETONES PO Take 2 tablets by mouth every morning.     . traMADol (ULTRAM) 50 MG tablet Take 50 mg by mouth every 6 (six) hours as needed for moderate pain.       Musculoskeletal: Strength & Muscle Tone: within normal limits Gait & Station: normal Patient leans: N/A  Psychiatric Specialty Exam: Physical Exam  Nursing note and vitals reviewed. Constitutional: She appears well-developed and well-nourished.  Cardiovascular: Normal rate and regular rhythm.    Review of Systems  Constitutional: Negative.   HENT: Negative.   Eyes: Negative.   Respiratory: Negative.   Cardiovascular: Negative.   Endocrine: Negative.   Genitourinary: Negative.   Neurological: Negative.     Blood pressure 104/73, pulse (!) 104, temperature 98.7 F (37.1 C), temperature source Oral, height  (1.676 m), weight 92.1 kg, last menstrual period 11/08/2019.Body mass index is 32.77 kg/m.  General Appearance: Disheveled  Eye Contact:  Minimal  Speech:  Pressured  Volume:  Mostly normal rate rambling speech pattern though  Mood:  Hypomanic  Affect:  Congruent  Thought Process:  Disorganized, Irrelevant and Descriptions of Associations: Loose   Orientation:  Other:  She knows she is hospitalized in [place] knows the month and year only  Thought Content:  Illogical, Delusions and Paranoid content  Suicidal Thoughts:  No  Homicidal Thoughts:  No  Memory:  Immediate;   Poor Recent;   Poor Remote;   Poor  Judgement:  Impaired  Insight:  Shallow  Psychomotor Activity:  Restlessness  Concentration:  Concentration: Poor and Attention Span: Poor  Recall:  Poor  Fund of Knowledge:  Poor  Language:  Poor  Akathisia:  Negative  Handed:  Right  AIMS (if indicated):     Assets:  Physical Health Resilience  ADL's:  Intact  Cognition:  WNL  Sleep:  Number of Hours: 4    Treatment Plan Summary: Daily contact with patient to assess and evaluate symptoms and progress in treatment and Medication management  Observation Level/Precautions:  15 minute checks  Laboratory:  UDS  Psychotherapy: Reality based  Medications: Begin antipsychotic mood stabilizer type therapy as needed lorazepam  Consultations: Not necessary  Discharge Concerns: Diagnostic clarity/abstinence from drugs/contact with family  Estimated LOS: 7-10  Other:    At this point in time her differential is:  Axis I schizophrenia acute exacerbation complicated by methamphetamine abuse Vs: Methamphetamine induced psychosis Axis II Limited information to discern Axis III history of cervical disc disease and surgery, laboratory work nondiagnostic other than positive drug screen for amphetamines/ no known allergies according to records/ latest QTC 455 from [facility] 12/16    Physician Treatment Plan for Primary Diagnosis: For psychosis begin antipsychotic therapy seek diagnostic clarity standard warnings are lost on the patient but will repeat to her when more  coherent Long Term Goal(s): Improvement in symptoms so as ready for discharge  Short Term Goals: Compliance with prescribed medications will improve  Physician Treatment Plan for Secondary Diagnosis:  Active Problems:   Schizoaffective disorder, bipolar type (HCC)  Long Term Goal(s): Improvement in symptoms so as ready for discharge  Short Term Goals: Ability to identify and develop effective coping behaviors will improve  I certify that inpatient services furnished can reasonably be expected to improve the patient's condition.    Malvin Johns, MD 12/18/202010:12 AM

## 2019-12-09 MED ORDER — CARBAMAZEPINE 100 MG PO CHEW
200.0000 mg | CHEWABLE_TABLET | Freq: Two times a day (BID) | ORAL | Status: DC
  Administered 2019-12-09 – 2019-12-14 (×10): 200 mg via ORAL
  Filled 2019-12-09 (×14): qty 2

## 2019-12-09 MED ORDER — NEOMYCIN-POLYMYXIN-HC 3.5-10000-1 OT SUSP
3.0000 [drp] | Freq: Four times a day (QID) | OTIC | Status: DC
  Administered 2019-12-09 – 2019-12-14 (×16): 3 [drp] via OTIC
  Filled 2019-12-09: qty 10

## 2019-12-09 MED ORDER — RISPERIDONE 3 MG PO TABS
3.0000 mg | ORAL_TABLET | Freq: Every day | ORAL | Status: DC
  Administered 2019-12-10 – 2019-12-12 (×3): 3 mg via ORAL
  Filled 2019-12-09 (×5): qty 1

## 2019-12-09 MED ORDER — RISPERIDONE 2 MG PO TABS
4.0000 mg | ORAL_TABLET | Freq: Every day | ORAL | Status: DC
  Administered 2019-12-09 – 2019-12-11 (×3): 4 mg via ORAL
  Filled 2019-12-09 (×5): qty 2

## 2019-12-09 NOTE — Progress Notes (Signed)
[facility] Group Notes:  (Nursing/MHT/Case Management/Adjunct)  Date:  12/09/2019  Time:  900  Type of Therapy:  Nurse Education Discussed coping skills. Implemented painting during this session as a positive coping skill for mental health disorders.    Participation Level:  Did Not Attend   Brittany Valdez  

## 2019-12-09 NOTE — Progress Notes (Signed)
   12/09/19 2222  Psych Admission Type (Psych Patients Only)  Admission Status Voluntary  Psychosocial Assessment  Patient Complaints Depression  Eye Contact Fair  Facial Expression Anxious;Sad  Affect Anxious;Preoccupied  Speech Tangential;Pressured;Rapid  Interaction Assertive  Motor Activity Slow  Appearance/Hygiene Disheveled;Poor hygiene;In scrubs  Behavior Characteristics Cooperative  Mood Sad;Preoccupied  Thought Process  Coherency Disorganized;Loose associations;Tangential  Content Delusions;Preoccupation  Delusions Grandeur  Perception Hallucinations  Hallucination None reported or observed  Judgment Poor  Confusion None  Danger to Self  Current suicidal ideation? Denies  Danger to Others  Danger to Others None reported or observed   Pt calm and cooperative. Pt denies SI, HI and AVH. Pt has pain in her left ear that is acute. Ear drops have been ordered for this. Pt answers questions appropriately but then goes into her delusion about needing help to find her houses because she has several houses. Pt took evening meds and something for sleep.

## 2019-12-09 NOTE — Progress Notes (Signed)
   12/09/19 0000  Psych Admission Type (Psych Patients Only)  Admission Status Voluntary  Psychosocial Assessment  Patient Complaints Suspiciousness  Eye Contact Fair  Facial Expression Anxious  Affect Anxious;Labile;Preoccupied  Speech Rapid;Pressured;Tangential  Interaction Assertive  Motor Activity Hyperactive  Appearance/Hygiene Bizarre;Disheveled;In scrubs;Poor hygiene  Behavior Characteristics Anxious  Mood Preoccupied  Thought Process  Coherency Blocking;Flight of ideas;Disorganized;Tangential;Loose associations  Content Delusions;Obsessions;Preoccupation  Delusions Grandeur;Paranoid  Perception Hallucinations  Hallucination Auditory (pt denies but responding to internal stimuli)  Judgment Poor  Confusion None  Danger to Self  Current suicidal ideation? Denies  Danger to Others  Danger to Others None reported or observed   Pt isolated to her room much of the evening

## 2019-12-09 NOTE — [facility] Counselor (Signed)
Clinical Social Work Note  Attempted to meet with patient to do Psychosocial Assessment, but she was sleeping soundly and it was decided to wait until tomorrow to attempt again.  Selmer Dominion, LCSW 12/09/2019, 4:05 PM

## 2019-12-09 NOTE — Plan of Care (Signed)
Pt still delusional; able to answer questions appropriately.  Problem: Education: Goal: Emotional status will improve Outcome: Progressing Goal: Mental status will improve Outcome: Progressing   Problem: Activity: Goal: Sleeping patterns will improve Outcome: Progressing   Problem: Safety: Goal: Periods of time without injury will increase Outcome: Progressing

## 2019-12-09 NOTE — Progress Notes (Signed)
[facility]  12/09/2019 10:56 AM Brittany Valdez  MRN:  [id number] Subjective: Patient is a 38 year old female with a past psychiatric history significant for schizophrenia who [facility] on 12/06/2019 under involuntary commitment.  She had been found by police standing in the rain and near freezing temperatures, talking in a nonsensical and delusional way.  Her drug screen was positive for amphetamines.  Objective: Patient is seen and examined.  Patient is a 38 year old female with the above-stated past psychiatric history who is seen in follow-up.  She wants to make sure that no one is causing her "infliction".  She continues to be paranoid, delusional.  She does complain of pain in her left ear.  She remains paranoid and wondering why people "wanted keep trying to get me and in flexion".  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  I mention to her that she had been found outside in the rain and freezing temperatures, and she stated she was just "walking to work".  Her current medications include Tegretol, Catapres, Neurontin, Ativan, Risperdal, temazepam and trazodone.  She denied suicidal or homicidal ideation.  She denied any auditory or visual hallucinations.  She clearly is still delusional and paranoid.  Her blood pressure stable, she is afebrile.  She slept 9.25 hours last night.  Principal Problem: <principal problem not specified> Diagnosis: Active Problems:   Schizoaffective disorder, bipolar type (HCC)  Total Time spent with patient: 20 minutes  Past Psychiatric History: See admission H&P  Past Medical History:  Past Medical History:  Diagnosis Date  . Chronic neck pain     Past Surgical History:  Procedure Laterality Date  . CERVICAL SPINE SURGERY    . TUBAL LIGATION     Family History: History reviewed. No pertinent family history. Family Psychiatric   History: See admission H&P Social History:  Social History   Substance and Sexual Activity  Alcohol Use No     Social History   Substance and Sexual Activity  Drug Use No    Social History   Socioeconomic History  . Marital status: Married    Spouse name: Not on file  . Number of children: Not on file  . Years of education: Not on file  . Highest education level: Not on file  Occupational History  . Not on file  Tobacco Use  . Smoking status: Current Every Day Smoker    Packs/day: 0.50    Types: Cigarettes  . Smokeless tobacco: Never Used  . Tobacco comment: during assessment pt states 1 p/day use  Substance and Sexual Activity  . Alcohol use: No  . Drug use: No  . Sexual activity: Yes    Birth control/protection: Surgical  Other Topics Concern  . Not on file  Social History Narrative  . Not on file   Social Determinants of Health   Financial Resource Strain:   . Difficulty of Paying Living Expenses: Not on file  Food Insecurity:   . Worried About Programme researcher, broadcasting/film/videounning Out of Food in the Last Year: Not on file  . Ran Out of Food in the Last Year: Not on file  Transportation Needs:   . Lack of Transportation (Medical): Not on file  . Lack of Transportation (Non-Medical): Not on file  Physical Activity:   . Days of Exercise per Week: Not on file  . Minutes of Exercise per Session: Not on file  Stress:   .  Feeling of Stress : Not on file  Social Connections:   . Frequency of Communication with Friends and Family: Not on file  . Frequency of Social Gatherings with Friends and Family: Not on file  . Attends Religious Services: Not on file  . Active Member of Clubs or Organizations: Not on file  . Attends Archivist Meetings: Not on file  . Marital Status: Not on file   Additional Social History:    Pain Medications: See MAR Prescriptions: See MAR Over the Counter: See MAR History of alcohol / drug use?: Yes Name of Substance 1: Amphetamine 1 - Age of First  Use: unknown 1 - Amount (size/oz): unknown 1 - Frequency: unknown 1 - Duration: unknown 1 - Last Use / Amount: unknown, labs positive on arrival to ED                  Sleep: Good  Appetite:  Fair  Current Medications: Current Facility-Administered Medications  Medication Dose Route Frequency Provider Last Rate Last Admin  . acetaminophen (TYLENOL) tablet 650 mg  650 mg Oral Q6H PRN Emmaline Kluver, FNP   650 mg at 12/08/19 0101  . alum & mag hydroxide-simeth (MAALOX/MYLANTA) 200-200-20 MG/5ML suspension 30 mL  30 mL Oral Q4H PRN Emmaline Kluver, FNP      . benztropine (COGENTIN) tablet 0.5 mg  0.5 mg Oral BID Johnn Hai, MD   0.5 mg at 12/09/19 0829  . carbamazepine (TEGRETOL) chewable tablet 200 mg  200 mg Oral BID Sharma Covert, MD      . cloNIDine (CATAPRES) tablet 0.2 mg  0.2 mg Oral Q4H PRN Johnn Hai, MD      . gabapentin (NEURONTIN) capsule 300 mg  300 mg Oral TID Johnn Hai, MD   300 mg at 12/09/19 9983  . hydrOXYzine (ATARAX/VISTARIL) tablet 25 mg  25 mg Oral TID PRN Emmaline Kluver, FNP   25 mg at 12/08/19 0101  . LORazepam (ATIVAN) tablet 1 mg  1 mg Oral Q6H PRN Johnn Hai, MD       Or  . LORazepam (ATIVAN) injection 2 mg  2 mg Intramuscular Q6H PRN Johnn Hai, MD      . magnesium hydroxide (MILK OF MAGNESIA) suspension 30 mL  30 mL Oral Daily PRN Emmaline Kluver, FNP      . neomycin-polymyxin-hydrocortisone (CORTISPORIN) OTIC (EAR) suspension 3 drop  3 drop Left EAR Q6H Yulisa Chirico, Cordie Grice, MD      . nicotine (NICODERM CQ - dosed in mg/24 hours) patch 14 mg  14 mg Transdermal Daily Anike, Adaku C, NP   14 mg at 12/09/19 0840  . [START ON 12/10/2019] risperiDONE (RISPERDAL) tablet 3 mg  3 mg Oral Daily Sharma Covert, MD      . risperiDONE (RISPERDAL) tablet 4 mg  4 mg Oral QHS Sharma Covert, MD      . temazepam (RESTORIL) capsule 30 mg  30 mg Oral QHS Johnn Hai, MD      . traZODone (DESYREL) tablet 50 mg  50 mg Oral QHS PRN Anike, Adaku C, NP   50 mg at  12/08/19 0101    Lab Results: No results found for this or any previous visit (from the past 29 hour(s)).  Blood Alcohol level:  No results found for: [facility]  Metabolic Disorder Labs: No results found for: HGBA1C, MPG No results found for: PROLACTIN No results found for: CHOL, TRIG, HDL, CHOLHDL, VLDL, LDLCALC  Physical Findings: AIMS:  Facial and Oral Movements Muscles of Facial Expression: None, normal Lips and Perioral Area: None, normal Jaw: None, normal Tongue: None, normal,Extremity Movements Upper (arms, wrists, hands, fingers): None, normal Lower (legs, knees, ankles, toes): None, normal, Trunk Movements Neck, shoulders, hips: None, normal, Overall Severity Severity of abnormal movements (highest score from questions above): None, normal Incapacitation due to abnormal movements: None, normal Patient's awareness of abnormal movements (rate only patient's report): No Awareness, Dental Status Current problems with teeth and/or dentures?: No Does patient usually wear dentures?: No  CIWA:  CIWA-Ar Total: 0 COWS:  COWS Total Score: 2  Musculoskeletal: Strength & Muscle Tone: within normal limits Gait & Station: normal Patient leans: N/A  Psychiatric Specialty Exam: Physical Exam  Nursing note and vitals reviewed. Constitutional: She is oriented to person, place, and time. She appears well-developed and well-nourished.  HENT:  Head: Normocephalic and atraumatic.  Respiratory: Effort normal.  Neurological: She is alert and oriented to person, place, and time.    Review of Systems  Blood pressure 104/73, pulse (!) 104, temperature 98.7 F (37.1 C), temperature source Oral, height 5\' 6"  (1.676 m), weight 92.1 kg, last menstrual period 11/08/2019.Body mass index is 32.77 kg/m.  General Appearance: Disheveled  Eye Contact:  Fair  Speech:  Normal Rate  Volume:  Normal  Mood:  Anxious, Depressed and Dysphoric  Affect:  Blunt  Thought Process:  Disorganized and Descriptions  of Associations: Loose  Orientation:  Negative  Thought Content:  Illogical, Delusions and Paranoid Ideation  Suicidal Thoughts:  No  Homicidal Thoughts:  No  Memory:  Immediate;   Poor Recent;   Poor Remote;   Poor  Judgement:  Impaired  Insight:  Lacking  Psychomotor Activity:  Increased  Concentration:  Concentration: Fair and Attention Span: Fair  Recall:  Poor  Fund of Knowledge:  Poor  Language:  Fair  Akathisia:  Negative  Handed:  Right  AIMS (if indicated):     Assets:  Desire for Improvement Resilience  ADL's:  Impaired  Cognition:  WNL  Sleep:  Number of Hours: 9.25     Treatment Plan Summary: Daily contact with patient to assess and evaluate symptoms and progress in treatment, Medication management and Plan : Patient is seen and examined.  Patient is a 38 year old female with the above-stated past psychiatric history who is seen in follow-up.   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  She remains disorganized and paranoid.  I will increase her Tegretol from 100 mg p.o. 3 times daily to 200 mg p.o. twice daily.  I will also increase her Risperdal from 3 mg p.o. twice daily to 3 mg p.o. daily and 4 mg p.o. nightly.  Unfortunately we do not have an ophthalmoscope, and I will go on and empirically treat her ear pain as though it was an infection given the fact she was outdoors, and the weather circumstances she was under.  There are no known drug allergies, so I will give her Cortisporin attic eardrops 3 drops in left ear every 6 hours for possible otitis.  No other change in her medications. 1.  Continue Cogentin 0.5 mg p.o. twice daily for side effects of medication. 2.  Increase Tegretol to 200 mg p.o. twice daily for mood stability. 4.  Continue clonidine 0.2 mg p.o. every 4 hours a diastolic blood pressure greater than 90. 5.  Continue  gabapentin 300 mg p.o. 3 times  daily for mood stability and anxiety. 6.  Continue hydroxyzine 25 mg p.o. 3 times daily as needed anxiety. 7.  Continue lorazepam 1 mg p.o. or 2 mg IM every 6 hours as needed anxiety or agitation. 8.  Start Cortisporin attic eardrops in left ear 3 drops every 6 hours for possible otitis media. 9.  Increase Risperdal to 3 mg p.o. daily and 4 mg p.o. nightly for psychosis. 10.  Continue temazepam 30 mg p.o. nightly for insomnia. 11.  Continue trazodone 50 mg p.o. nightly as needed insomnia. 12.  Disposition planning-in progress.  Antonieta Pert, MD 12/09/2019, 10:56 AM

## 2019-12-09 NOTE — Progress Notes (Signed)
   12/09/19 1600  Psych Admission Type (Psych Patients Only)  Admission Status Voluntary  Psychosocial Assessment  Patient Complaints Depression  Eye Contact Fair  Facial Expression Anxious  Affect Anxious;Labile;Preoccupied  Speech Rapid;Pressured;Tangential  Interaction Assertive  Motor Activity Hyperactive  Appearance/Hygiene Disheveled;Poor hygiene;In scrubs  Behavior Characteristics Cooperative  Mood Preoccupied  Thought Process  Coherency Disorganized;Loose associations;Tangential  Content Delusions;Preoccupation  Delusions Paranoid  Perception Hallucinations  Hallucination Auditory (pt denies but responding to internal stimuli)  Judgment Poor  Confusion None  Danger to Self  Current suicidal ideation? Denies  Danger to Others  Danger to Others None reported or observed

## 2019-12-10 NOTE — Progress Notes (Signed)
[facility] MD Progress Note  12/10/2019 10:59 AM Brittany Valdez  MRN:  [id number] Subjective:  Patient is a 38 year old female with a past psychiatric history significant for schizophrenia who presented to the [facility] emergency department on 12/06/2019 under involuntary commitment.  She had been found by police standing in the rain and near freezing temperatures, talking in a nonsensical and delusional way.  Her drug screen was positive for amphetamines.  Objective: Patient is seen and examined.  Patient is a 38 year old female with the above-stated past psychiatric history is seen in follow-up.  He is much less agitated today.  She is resting comfortably.  She denied auditory or visual hallucinations.  Some of her paranoid delusions appear to be fixed.  She did state her ear was feeling much better after the antibiotic drops were started.  She denied any suicidal or homicidal ideation.  Her vital signs are stable, she is afebrile.  She slept 6.75 hours last night.  Principal Problem: <principal problem not specified> Diagnosis: Active Problems:   Schizoaffective disorder, bipolar type (HCC)  Total Time spent with patient: 20 minutes  Past Psychiatric History: See admission H&P  Past Medical History:  Past Medical History:  Diagnosis Date  . Chronic neck pain     Past Surgical History:  Procedure Laterality Date  . CERVICAL SPINE SURGERY    . TUBAL LIGATION     Family History: History reviewed. No pertinent family history. Family Psychiatric  History: See admission H&P Social History:  Social History   Substance and Sexual Activity  Alcohol Use No     Social History   Substance and Sexual Activity  Drug Use No    Social History   Socioeconomic History  . Marital status: Married    Spouse name: Not on file  . Number of children: Not on file  . Years of education: Not on file  . Highest education level: Not on file  Occupational History  . Not on file  Tobacco Use   . Smoking status: Current Every Day Smoker    Packs/day: 0.50    Types: Cigarettes  . Smokeless tobacco: Never Used  . Tobacco comment: during assessment pt states 1 p/day use  Substance and Sexual Activity  . Alcohol use: No  . Drug use: No  . Sexual activity: Yes    Birth control/protection: Surgical  Other Topics Concern  . Not on file  Social History Narrative  . Not on file   Social Determinants of Health   Financial Resource Strain:   . Difficulty of Paying Living Expenses: Not on file  Food Insecurity:   . Worried About Programme researcher, broadcasting/film/video in the Last Year: Not on file  . Ran Out of Food in the Last Year: Not on file  Transportation Needs:   . Lack of Transportation (Medical): Not on file  . Lack of Transportation (Non-Medical): Not on file  Physical Activity:   . Days of Exercise per Week: Not on file  . Minutes of Exercise per Session: Not on file  Stress:   . Feeling of Stress : Not on file  Social Connections:   . Frequency of Communication with Friends and Family: Not on file  . Frequency of Social Gatherings with Friends and Family: Not on file  . Attends Religious Services: Not on file  . Active Member of Clubs or Organizations: Not on file  . Attends Banker Meetings: Not on file  . Marital Status: Not on file  Additional Social History:    Pain Medications: See MAR Prescriptions: See MAR Over the Counter: See MAR History of alcohol / drug use?: Yes Name of Substance 1: Amphetamine 1 - Age of First Use: unknown 1 - Amount (size/oz): unknown 1 - Frequency: unknown 1 - Duration: unknown 1 - Last Use / Amount: unknown, labs positive on arrival to ED                  Sleep: Good  Appetite:  Fair  Current Medications: Current Facility-Administered Medications  Medication Dose Route Frequency Provider Last Rate Last Admin  . acetaminophen (TYLENOL) tablet 650 mg  650 mg Oral Q6H PRN Emmaline Kluver, FNP   650 mg at 12/09/19 1112   . alum & mag hydroxide-simeth (MAALOX/MYLANTA) 200-200-20 MG/5ML suspension 30 mL  30 mL Oral Q4H PRN Emmaline Kluver, FNP      . benztropine (COGENTIN) tablet 0.5 mg  0.5 mg Oral BID Johnn Hai, MD   0.5 mg at 12/10/19 0801  . carbamazepine (TEGRETOL) chewable tablet 200 mg  200 mg Oral BID Sharma Covert, MD   200 mg at 12/10/19 0801  . cloNIDine (CATAPRES) tablet 0.2 mg  0.2 mg Oral Q4H PRN Johnn Hai, MD      . gabapentin (NEURONTIN) capsule 300 mg  300 mg Oral TID Johnn Hai, MD   300 mg at 12/10/19 0801  . hydrOXYzine (ATARAX/VISTARIL) tablet 25 mg  25 mg Oral TID PRN Emmaline Kluver, FNP   25 mg at 12/10/19 1041  . LORazepam (ATIVAN) tablet 1 mg  1 mg Oral Q6H PRN Johnn Hai, MD   1 mg at 12/09/19 1108   Or  . LORazepam (ATIVAN) injection 2 mg  2 mg Intramuscular Q6H PRN Johnn Hai, MD      . magnesium hydroxide (MILK OF MAGNESIA) suspension 30 mL  30 mL Oral Daily PRN Emmaline Kluver, FNP      . neomycin-polymyxin-hydrocortisone (CORTISPORIN) OTIC (EAR) suspension 3 drop  3 drop Left EAR Q6H Sharma Covert, MD   3 drop at 12/10/19 0617  . nicotine (NICODERM CQ - dosed in mg/24 hours) patch 14 mg  14 mg Transdermal Daily Anike, Adaku C, NP   14 mg at 12/10/19 0803  . risperiDONE (RISPERDAL) tablet 3 mg  3 mg Oral Daily Sharma Covert, MD   3 mg at 12/10/19 0802  . risperiDONE (RISPERDAL) tablet 4 mg  4 mg Oral QHS Sharma Covert, MD   4 mg at 12/09/19 2109  . temazepam (RESTORIL) capsule 30 mg  30 mg Oral QHS Johnn Hai, MD   30 mg at 12/09/19 2108  . traZODone (DESYREL) tablet 50 mg  50 mg Oral QHS PRN Anike, Adaku C, NP   50 mg at 12/08/19 0101    Lab Results: No results found for this or any previous visit (from the past 48 hour(s)).  Blood Alcohol level:  No results found for: [facility]  Metabolic Disorder Labs: No results found for: HGBA1C, MPG No results found for: PROLACTIN No results found for: CHOL, TRIG, HDL, CHOLHDL, VLDL, LDLCALC  Physical  Findings: AIMS: Facial and Oral Movements Muscles of Facial Expression: None, normal Lips and Perioral Area: None, normal Jaw: None, normal Tongue: None, normal,Extremity Movements Upper (arms, wrists, hands, fingers): None, normal Lower (legs, knees, ankles, toes): None, normal, Trunk Movements Neck, shoulders, hips: None, normal, Overall Severity Severity of abnormal movements (highest score from questions above): None, normal Incapacitation due  to abnormal movements: None, normal Patient's awareness of abnormal movements (rate only patient's report): No Awareness, Dental Status Current problems with teeth and/or dentures?: No Does patient usually wear dentures?: No  CIWA:  CIWA-Ar Total: 0 COWS:  COWS Total Score: 2  Musculoskeletal: Strength & Muscle Tone: within normal limits Gait & Station: normal Patient leans: N/A  Psychiatric Specialty Exam: Physical Exam  Nursing note and vitals reviewed. Constitutional: She is oriented to person, place, and time. She appears well-developed and well-nourished.  HENT:  Head: Normocephalic and atraumatic.  Respiratory: Effort normal.  Neurological: She is alert and oriented to person, place, and time.    Review of Systems  Blood pressure (!) 119/94, pulse 98, temperature 98.1 F (36.7 C), temperature source Oral, height 5\' 6"  (1.676 m), weight 92.1 kg, last menstrual period 11/08/2019.Body mass index is 32.77 kg/m.  General Appearance: Disheveled  Eye Contact:  Fair  Speech:  Normal Rate  Volume:  Decreased  Mood:  Dysphoric  Affect:  Congruent  Thought Process:  Coherent and Descriptions of Associations: Circumstantial  Orientation:  Full (Time, Place, and Person)  Thought Content:  Delusions and Paranoid Ideation  Suicidal Thoughts:  No  Homicidal Thoughts:  No  Memory:  Immediate;   Fair Recent;   Fair Remote;   Fair  Judgement:  Impaired  Insight:  Lacking  Psychomotor Activity:  Decreased  Concentration:   Concentration: Fair and Attention Span: Fair  Recall:  [employer]  Fund of Knowledge:  Fair  Language:  Fair  Akathisia:  Negative  Handed:  Right  AIMS (if indicated):     Assets:  Desire for Improvement Resilience  ADL's:  Intact  Cognition:  WNL  Sleep:  Number of Hours: 6.75     Treatment Plan Summary: Daily contact with patient to assess and evaluate symptoms and progress in treatment, Medication management and Plan : Diagnosis: #1 schizophrenia versus substance-induced psychotic disorder, #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  She is much better today.  Much less disorganized, but the paranoia continues.  She did sleep better last night.  Much of that may be due secondary to the improvement in her ear pain.  No change in her medications today, and hopefully she will continue to improve.  1.  Continue Cogentin 0.5 mg p.o. twice daily for side effects of medication. 2.    Continue Tegretol to 200 mg p.o. twice daily for mood stability. 4.  Continue clonidine 0.2 mg p.o. every 4 hours a diastolic blood pressure greater than 90. 5.  Continue gabapentin 300 mg p.o. 3 times daily for mood stability and anxiety. 6.  Continue hydroxyzine 25 mg p.o. 3 times daily as needed anxiety. 7.  Continue lorazepam 1 mg p.o. or 2 mg IM every 6 hours as needed anxiety or agitation. 8.    Continue Cortisporin otic eardrops in left ear 3 drops every 6 hours for otitis media. 9.    Continue Risperdal to 3 mg p.o. daily and 4 mg p.o. nightly for psychosis. 10.  Continue temazepam 30 mg p.o. nightly for insomnia. 11.  Continue trazodone 50 mg p.o. nightly as needed insomnia. 12.  Disposition planning-in progress.  Antonieta PertGreg Lawson Baylon Santelli, MD 12/10/2019, 10:59 AM

## 2019-12-10 NOTE — Progress Notes (Addendum)
   12/10/19 0900  Psych Admission Type (Psych Patients Only)  Admission Status Voluntary  Psychosocial Assessment  Patient Complaints Depression  Eye Contact Fair  Facial Expression Anxious;Sad  Affect Anxious;Preoccupied  Speech Tangential;Pressured;Rapid  Interaction Assertive  Motor Activity Slow  Appearance/Hygiene Disheveled;Poor hygiene;In scrubs  Behavior Characteristics Cooperative  Mood Preoccupied;Sad  Thought Process  Coherency Disorganized;Loose associations;Tangential  Content Delusions;Preoccupation  Delusions Paranoid  Perception WDL  Hallucination None reported or observed  Judgment Poor  Confusion None  Danger to Self  Current suicidal ideation? Denies  Danger to Others  Danger to Others None reported or observed    Patient is calm and cooperative. Pt reports having slept well last night, and improved  ear pain this am. Patient took am meds, then returned to bed

## 2019-12-10 NOTE — Progress Notes (Signed)
[place] NOVEL CORONAVIRUS (COVID-19) DAILY CHECK-OFF SYMPTOMS - answer yes or no to each - every day NO YES  Have you had a fever in the past 24 hours?  . Fever (Temp > 37.80C / 100F) X   Have you had any of these symptoms in the past 24 hours? . New Cough .  Sore Throat  .  Shortness of Breath .  Difficulty Breathing .  Unexplained Body Aches   X   Have you had any one of these symptoms in the past 24 hours not related to allergies?   . Runny Nose .  Nasal Congestion .  Sneezing   X   If you have had runny nose, nasal congestion, sneezing in the past 24 hours, has it worsened?  X   EXPOSURES - check yes or no X   Have you traveled outside the state in the past 14 days?  X   Have you been in contact with someone with a confirmed diagnosis of COVID-19 or PUI in the past 14 days without wearing appropriate PPE?  X   Have you been living in the same home as a person with confirmed diagnosis of COVID-19 or a PUI (household contact)?    X   Have you been diagnosed with COVID-19?    X              What to do next: Answered NO to all: Answered YES to anything:   Proceed with unit schedule Follow the [facility] Inpatient Flowsheet.   

## 2019-12-10 NOTE — [facility] Counselor (Signed)
Clinical Social Work Note  CSW attempted to do Psychosocial Assessment with patient, obtained little information that could be understood.  Another attempt will be made to complete by weekday CSW.  In the meantime, obtained written consent to talk with either patient's mother Danny Lawless or father Chriss Czar, but she does not know their telephone numbers.  The number for mother that is in the chart [phone] was attempted, is not in service.    A review of the electronic record was done searching for a phone number for mother or father, but none was found.  A review of the written record sent from [facility] at the time of admission was done, with no success.  The record does show that in 10/2019 patient was seen in the ED at [facility] with assaultive behavior, was on benzodiazepines and methamphetamines as noted in the ED note:  "38yo female who presented with altered and agitated. Pt appears to have likely acute amphetamine intoxication."  Paperwork from [facility] includes a spouse Willean Schurman [phone], while [facility] EHR includes a spouse Kandy Towery [phone].  In either case, patient will not give consent for contact.   Selmer Dominion, LCSW 12/10/2019, 3:13 PM

## 2019-12-10 NOTE — Progress Notes (Signed)
[facility] Group Notes:  (Nursing/MHT/Case Management/Adjunct)  Date:  12/10/2019  Time:  0900 am  Type of Therapy:  Nurse Education Discussed coping skills for mental health disorders and created collages.   Participation Level:  Did Not Attend  

## 2019-12-10 NOTE — Progress Notes (Signed)
Patient request ativan and Probation officer informed her to speak with the doctor about getting ativan. She reports that she does better on ativan. She reports that one of her brothers said they will pick her up when she is discharged. She reports that this brother is one of 4 that she didn't know she had.    12/10/19 2055  Psych Admission Type (Psych Patients Only)  Admission Status Voluntary  Psychosocial Assessment  Patient Complaints None  Eye Contact Fair  Facial Expression Flat;Sad  Affect Appropriate to circumstance;Blunted;Preoccupied  Speech Tangential;Pressured;Rapid  Interaction Assertive  Motor Activity Slow  Appearance/Hygiene Disheveled;Poor hygiene;In scrubs  Behavior Characteristics Cooperative;Appropriate to situation  Mood Preoccupied;Pleasant  Thought Process  Coherency Disorganized;Loose associations;Tangential  Content Delusions;Preoccupation  Delusions Paranoid  Perception WDL  Hallucination None reported or observed  Judgment Poor  Confusion None  Danger to Self  Current suicidal ideation? Denies  Danger to Others  Danger to Others None reported or observed

## 2019-12-11 NOTE — Progress Notes (Signed)
Recreation Therapy Notes  Date: 12.21.20 Time: 1000 Location: 500 Hall Dayroom  Group Topic: Coping Skills  Goal Area(s) Addresses:  Patient will identify positive coping skills. Patient will identify benefit of using positive coping skills.  Intervention: Worksheet  Activity: Coping Skills A to Z.  Patients were to identify coping skills for each letter of the alphabet.  Patients would then identify the top five coping skills they use.  Education: Radiographer, therapeutic, Dentist.   Education Outcome: Acknowledges understanding/In group clarification offered/Needs additional education.   Clinical Observations/Feedback: Pt did not attend group.    Victorino Sparrow, LRT/CTRS    Ria Comment, Dwana Garin A 12/11/2019 11:08 AM

## 2019-12-11 NOTE — Progress Notes (Signed)
   12/11/19 2200  Psych Admission Type (Psych Patients Only)  Admission Status Voluntary  Psychosocial Assessment  Patient Complaints None  Eye Contact Fair  Facial Expression Flat;Sad  Affect Appropriate to circumstance;Blunted;Preoccupied  Speech Tangential;Pressured;Rapid  Interaction Assertive  Motor Activity Slow  Appearance/Hygiene Disheveled;Poor hygiene;In scrubs  Behavior Characteristics Cooperative  Mood Suspicious  Thought Process  Coherency Disorganized;Loose associations;Tangential  Content Delusions;Preoccupation  Delusions Paranoid  Perception WDL  Hallucination None reported or observed  Judgment Poor  Confusion None  Danger to Self  Current suicidal ideation? Denies  Danger to Others  Danger to Others None reported or observed

## 2019-12-11 NOTE — Progress Notes (Signed)
Recreation Therapy Notes  INPATIENT RECREATION THERAPY ASSESSMENT  Patient Details Name: Brittany Valdez MRN: [id number] DOB: 11/25/1981 Today's Date: 12/11/2019       Information Obtained From: Patient  Able to Participate in Assessment/Interview: Yes  Patient Presentation: (Tearful)  Reason for Admission (Per Patient): Other (Comments)(Pt stated she was forcefully brought here by the police.)  Patient Stressors: Other (Comment)("people trying to take my life from me" and "people trying to lie on me")  Coping Skills:   Journal, Sports, TV, Music, Exercise, Meditate, Deep Breathing, Talk, Art, Prayer, Read, Hot Bath/Shower, Dance  Leisure Interests (2+):  Individual - Reading, Social - Friends, Social - Family, Individual - Other (Comment)(Problem solve; Engineer, site; ways to help people)  Frequency of Recreation/Participation: Weekly  Awareness of Community Resources:  Yes  Community Resources:  Other (Comment)(Shelter; Resource places)  Current Use: No  If no, Barriers?: (Didn't specify)  Expressed Interest in [employer] Information: No  County of Residence:  [place]  Patient Main Form of Transportation: Other (Comment)(Medicaid transportation)  Patient Strengths:  Free will; Being strong  Patient Identified Areas of Improvement:  "I don't want to do no bad stuff"  Patient Goal for Hospitalization:  "I'm ready to go home"  Current SI (including self-harm):  No  Current HI:  No  Current AVH: No  Staff Intervention Plan: Group Attendance, Collaborate with Interdisciplinary Treatment Team  Consent to Intern Participation: N/A    Victorino Sparrow, LRT/CTRS  Victorino Sparrow A 12/11/2019, 1:26 PM

## 2019-12-11 NOTE — Progress Notes (Signed)
[facility] MD Progress Note  12/11/2019 7:57 AM Brittany Valdez  MRN:  [id number] Subjective:   Brittany Valdez is 38 years of age and she presented under petition for involuntary commitment.  She had presented initially to [facility] on 12/16, after she was found by police standing in the rain, in near freezing temperatures, and speaking in nonsensical and/or delusional statements.  Her drug screen was positive for amphetamines only.  Though the patient is improved she is no closer to providing a valid history.  She rambles, she continues to give unclear answers that are confused with delusion.  She states she lives with her dad that she correct her self that she lives with a friend named Engineer, civil (consulting).  She states that she had amphetamines in her drug screen because "that is for my sinuses it is legal in the future -it is in the future that is what you all do not understand, this is the future" Continues to state that "a scientist lives with me and gives me drugs in the future"  Redirectable but again disorganized in thought, unsure of day date and time. Remains very delusional to the point of nonsensical statements.  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 Chronic neck pain     Past Surgical History:  Procedure Laterality Date  . CERVICAL SPINE SURGERY    . TUBAL LIGATION     Family History: History reviewed. No pertinent family history. Family Psychiatric  History: Unknown Social History:  Social History   Substance and Sexual Activity  Alcohol Use No     Social History   Substance and Sexual Activity  Drug Use No    Social History   Socioeconomic History  . Marital status: Married     Spouse name: Not on file  . Number of children: Not on file  . Years of education: Not on file  . Highest education level: Not on file  Occupational History  . Not on file  Tobacco Use  . Smoking status: Current Every Day Smoker    Packs/day: 0.50    Types: Cigarettes  . Smokeless tobacco: Never Used  . Tobacco comment: during assessment pt states 1 p/day use  Substance and Sexual Activity  . Alcohol use: No  . Drug use: No  . Sexual activity: Yes    Birth control/protection: Surgical  Other Topics Concern  . Not on file  Social History Narrative  . Not on file   Social Determinants of Health   Financial Resource Strain:   . Difficulty of Paying Living Expenses: Not on file  Food Insecurity:   . Worried About Charity fundraiser in the Last Year: Not on file  . Ran Out of Food in the Last Year: Not on file  Transportation Needs:   . Lack of Transportation (Medical): Not on file  . Lack of Transportation (Non-Medical): Not on file  Physical Activity:   . Days of Exercise per Week: Not on file  . Minutes of Exercise per Session: Not on file  Stress:   . Feeling of Stress : Not on file  Social Connections:   . Frequency of Communication with Friends and Family: Not on file  . Frequency of Social Gatherings with Friends and Family: Not  on file  . Attends Religious Services: Not on file  . Active Member of Clubs or Organizations: Not on file  . Attends Banker Meetings: Not on file  . Marital Status: Not on file   Additional Social History:    Pain Medications: See MAR Prescriptions: See MAR Over the Counter: See MAR History of alcohol / drug use?: Yes Name of Substance 1: Amphetamine 1 - Age of First Use: unknown 1 - Amount (size/oz): unknown 1 - Frequency: unknown 1 - Duration: unknown 1 - Last Use / Amount: unknown, labs positive on arrival to ED                  Sleep: Fair  Appetite:  Fair  Current Medications: Current  Facility-Administered Medications  Medication Dose Route Frequency Provider Last Rate Last Admin  . acetaminophen (TYLENOL) tablet 650 mg  650 mg Oral Q6H PRN Patrcia Dolly, FNP   650 mg at 12/09/19 1112  . alum & mag hydroxide-simeth (MAALOX/MYLANTA) 200-200-20 MG/5ML suspension 30 mL  30 mL Oral Q4H PRN Patrcia Dolly, FNP      . benztropine (COGENTIN) tablet 0.5 mg  0.5 mg Oral BID Malvin Johns, MD   0.5 mg at 12/11/19 0752  . carbamazepine (TEGRETOL) chewable tablet 200 mg  200 mg Oral BID Antonieta Pert, MD   200 mg at 12/11/19 6270  . cloNIDine (CATAPRES) tablet 0.2 mg  0.2 mg Oral Q4H PRN Malvin Johns, MD      . gabapentin (NEURONTIN) capsule 300 mg  300 mg Oral TID Malvin Johns, MD   300 mg at 12/11/19 3500  . hydrOXYzine (ATARAX/VISTARIL) tablet 25 mg  25 mg Oral TID PRN Patrcia Dolly, FNP   25 mg at 12/10/19 1041  . magnesium hydroxide (MILK OF MAGNESIA) suspension 30 mL  30 mL Oral Daily PRN Patrcia Dolly, FNP      . neomycin-polymyxin-hydrocortisone (CORTISPORIN) OTIC (EAR) suspension 3 drop  3 drop Left EAR Q6H Antonieta Pert, MD   3 drop at 12/11/19 0751  . nicotine (NICODERM CQ - dosed in mg/24 hours) patch 14 mg  14 mg Transdermal Daily Anike, Adaku C, NP   14 mg at 12/11/19 0752  . risperiDONE (RISPERDAL) tablet 3 mg  3 mg Oral Daily Antonieta Pert, MD   3 mg at 12/11/19 9381  . risperiDONE (RISPERDAL) tablet 4 mg  4 mg Oral QHS Antonieta Pert, MD   4 mg at 12/10/19 2051  . temazepam (RESTORIL) capsule 30 mg  30 mg Oral QHS Malvin Johns, MD   30 mg at 12/10/19 2051  . traZODone (DESYREL) tablet 50 mg  50 mg Oral QHS PRN Anike, Adaku C, NP   50 mg at 12/08/19 0101    Lab Results: No results found for this or any previous visit (from the past 48 hour(s)).  Blood Alcohol level:  No results found for: [facility]  Metabolic Disorder Labs: No results found for: HGBA1C, MPG No results found for: PROLACTIN No results found for: CHOL, TRIG, HDL, CHOLHDL, VLDL,  LDLCALC  Physical Findings: AIMS: Facial and Oral Movements Muscles of Facial Expression: None, normal Lips and Perioral Area: None, normal Jaw: None, normal Tongue: None, normal,Extremity Movements Upper (arms, wrists, hands, fingers): None, normal Lower (legs, knees, ankles, toes): None, normal, Trunk Movements Neck, shoulders, hips: None, normal, Overall Severity Severity of abnormal movements (highest score from questions above): None, normal Incapacitation due to abnormal movements: None, normal Patient's  awareness of abnormal movements (rate only patient's report): No Awareness, Dental Status Current problems with teeth and/or dentures?: No Does patient usually wear dentures?: No  CIWA:  CIWA-Ar Total: 0 COWS:  COWS Total Score: 2  Musculoskeletal: Strength & Muscle Tone: within normal limits Gait & Station: normal Patient leans: N/A  Psychiatric Specialty Exam: Physical Exam  Review of Systems  Blood pressure (!) 126/98, pulse (!) 110, temperature (!) 97.5 F (36.4 C), temperature source Oral, resp. rate 20, height 5\' 6"  (1.676 m), weight 92.1 kg, last menstrual period 11/08/2019, SpO2 100 %.Body mass index is 32.77 kg/m.  General Appearance: Disheveled  Eye Contact:  Fair  Speech:  Pressured  Volume:  Decreased  Mood:  Hypomanic at baseline  Affect:  Blunt  Thought Process:  Disorganized, Irrelevant and Descriptions of Associations: Loose  Orientation:  Other:  Person place general situation  Thought Content:  Illogical and Delusions  Suicidal Thoughts:  No  Homicidal Thoughts:  No  Memory:  Immediate;   Poor Recent;   Poor Remote;   Poor  Judgement:  Impaired  Insight:  Lacking  Psychomotor Activity:  Normal  Concentration:  Concentration: Poor  Recall:  Poor  Fund of Knowledge:  Poor  Language:  Poor  Akathisia:  Negative  Handed:  Right  AIMS (if indicated):     Assets:  Leisure Time Physical Health  ADL's:  Intact  Cognition:  WNL  Sleep:  Number  of Hours: 6.75     Treatment Plan Summary: Daily contact with patient to assess and evaluate symptoms and progress in treatment and Medication management  Continue to seek diagnostic clarity try to get a hold of family members patient states she has some phone numbers in her purse we do not know if this is accurate or not continue antipsychotic therapy reality based therapy it is unclear what her baseline is without talking to family.  No change in meds today  Tye Juarez, MD 12/11/2019, 7:57 AM

## 2019-12-12 MED ORDER — DICYCLOMINE HCL 20 MG PO TABS
20.0000 mg | ORAL_TABLET | Freq: Three times a day (TID) | ORAL | Status: DC | PRN
  Administered 2019-12-12: 20 mg via ORAL
  Filled 2019-12-12: qty 1

## 2019-12-12 NOTE — Progress Notes (Signed)
Patient ID: Brittany Valdez, female   DOB: 09-24-1981, 38 y.o.   MRN: [id number]   CSW attempted to contact the phone number: [phone] that patient gave to the medical student that is her mother's phone number. The phone number has been disconnected.

## 2019-12-12 NOTE — Progress Notes (Signed)
Recreation Therapy Notes  Date: 12.22.20 Time: 1000 Location: 500 Hall  Group Topic: Self-Esteem  Goal Area(s) Addresses:  Patient will successfully identify positive attributes about themselves.  Patient will successfully identify benefit of improved self-esteem.   Behavioral Response: Engaged  Intervention: Worksheet  Activity: Education officer, environmental.  Patients were to identify 3 positives things that have helped to shape them in the categories of things they're good at; compliments; challenges overcome; things that make them unique; what they value, how they have helped others, times they have made others happy and what they like about their appearance.  Education:  Self-Esteem, Dentist.   Education Outcome: Acknowledges education/In group clarification offered/Needs additional education  Clinical Observations/Feedback: Pt expressed she was ready to go home and see her kids for Christmas.  Pt became tearful at times.  Pt expressed being good at being a mother, provider and caregiver; what pt likes about appearance are her eyes, hair and teeth; challenges overcome were death, pain and sorrow; helped others by "making sure they have what they need", not letting people down and not lying to people; what makes pt unique- "cares more than the average joe", artistic and "when everything is in order, things are usually good for everyone else; values most- kids, life, sanity and God/Jesus; and pt stated she had made other people happy "my whole life".     Victorino Sparrow, LRT/CTRS    Victorino Sparrow A 12/12/2019 10:56 AM

## 2019-12-12 NOTE — Progress Notes (Addendum)
   Alera would like [place] to reach out to her mom Danny Lawless at [phone] and ask if she can stay with her or brother, Brittany Valdez, after discharge. According to [facility] her mother does not pick up the phone when she calls.

## 2019-12-12 NOTE — Progress Notes (Signed)
   12/12/19 2200  Psych Admission Type (Psych Patients Only)  Admission Status Voluntary  Psychosocial Assessment  Patient Complaints None  Eye Contact Fair  Facial Expression Flat;Sad  Affect Appropriate to circumstance;Blunted;Preoccupied  Speech Tangential;Pressured;Rapid  Interaction Assertive  Motor Activity Slow  Appearance/Hygiene Disheveled;Poor hygiene;In scrubs  Behavior Characteristics Cooperative  Mood Suspicious  Thought Process  Coherency Disorganized;Loose associations;Tangential  Content Delusions;Preoccupation  Delusions Paranoid  Perception WDL  Hallucination None reported or observed  Judgment Poor  Confusion None  Danger to Self  Current suicidal ideation? Denies  Danger to Others  Danger to Others None reported or observed   Pt continues to appear paranoid, pt isolated to her room much of the evening.

## 2019-12-12 NOTE — Progress Notes (Addendum)
[facility] MD Progress Note  12/12/2019 8:47 AM Brittany Valdez  MRN:  [id number] Subjective:   Brittany Valdez is 38 years of age and she presented under petition for involuntary commitment.  She had presented initially to [facility] on 12/16, after she was found by police standing in the rain, in near freezing temperatures, and speaking in nonsensical and/or delusional statements.  Her drug screen was positive for amphetamines only.  Today she was in bed and made no effort to sit up during the interview. She had good eye contact, her thought process was logical. Would like to be discharged to her Mom and Dad's house prior to Christmas but there are no phone records or way to contact them at this time. The second option is a shelter. She denied HI, SI, AH and VH.  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 No pertinent family history. Family Psychiatric  History: Unknown Social History:  Social History   Substance and Sexual Activity  Alcohol Use No     Social History   Substance and Sexual Activity  Drug Use No    Social History   Socioeconomic History   Marital status: Married    Spouse name: Not on file   Number of children: Not on file   Years of education: Not on file   Highest education level: Not on file  Occupational History   Not on file  Tobacco Use   Smoking status: Current Every Day Smoker    Packs/day:  0.50    Types: Cigarettes   Smokeless tobacco: Never Used   Tobacco comment: during assessment pt states 1 p/day use  Substance and Sexual Activity   Alcohol use: No   Drug use: No   Sexual activity: Yes    Birth control/protection: Surgical  Other Topics Concern   Not on file  Social History Narrative   Not on file   Social Determinants of Health   Financial Resource Strain:    Difficulty of Paying Living Expenses: Not on file  Food Insecurity:    Worried About [place] in the Last Year: Not on file   [employer] of Food in the Last Year: Not on file  Transportation Needs:    Lack of Transportation (Medical): Not on file   Lack of Transportation (Non-Medical): Not on file  Physical Activity:    Days of Exercise per Week: Not on file   Minutes of Exercise per Session: Not on file  Stress:    Feeling of Stress : Not on file  Social Connections:    Frequency of Communication with Friends and Family: Not on file   Frequency of Social Gatherings with Friends and Family: Not on file   Attends Religious Services: Not on file   Active Member of Clubs or Organizations: Not on file   Attends Archivist Meetings: Not on file   Marital Status: Not on file   Additional Social  History:    Pain Medications: See MAR Prescriptions: See MAR Over the Counter: See MAR History of alcohol / drug use?: Yes Name of Substance 1: Amphetamine 1 - Age of First Use: unknown 1 - Amount (size/oz): unknown 1 - Frequency: unknown 1 - Duration: unknown 1 - Last Use / Amount: unknown, labs positive on arrival to ED                  Sleep: Fair  Appetite:  Fair  Current Medications: Current Facility-Administered Medications  Medication Dose Route Frequency Provider Last Rate Last Admin   acetaminophen (TYLENOL) tablet 650 mg  650 mg Oral Q6H PRN Emmaline Kluver, FNP   650 mg at 12/09/19 1112   alum & mag hydroxide-simeth (MAALOX/MYLANTA) 200-200-20 MG/5ML suspension 30 mL   30 mL Oral Q4H PRN Emmaline Kluver, FNP       benztropine (COGENTIN) tablet 0.5 mg  0.5 mg Oral BID Johnn Hai, MD   0.5 mg at 12/12/19 0800   carbamazepine (TEGRETOL) chewable tablet 200 mg  200 mg Oral BID Sharma Covert, MD   200 mg at 12/12/19 0800   cloNIDine (CATAPRES) tablet 0.2 mg  0.2 mg Oral Q4H PRN Johnn Hai, MD       gabapentin (NEURONTIN) capsule 300 mg  300 mg Oral TID Johnn Hai, MD   300 mg at 12/12/19 0800   hydrOXYzine (ATARAX/VISTARIL) tablet 25 mg  25 mg Oral TID PRN Emmaline Kluver, FNP   25 mg at 12/11/19 1429   magnesium hydroxide (MILK OF MAGNESIA) suspension 30 mL  30 mL Oral Daily PRN Emmaline Kluver, FNP       neomycin-polymyxin-hydrocortisone (CORTISPORIN) OTIC (EAR) suspension 3 drop  3 drop Left EAR Q6H Sharma Covert, MD   3 drop at 12/12/19 0759   nicotine (NICODERM CQ - dosed in mg/24 hours) patch 14 mg  14 mg Transdermal Daily Anike, Adaku C, NP   14 mg at 12/12/19 0800   risperiDONE (RISPERDAL) tablet 3 mg  3 mg Oral Daily Sharma Covert, MD   3 mg at 12/12/19 0800   risperiDONE (RISPERDAL) tablet 4 mg  4 mg Oral QHS Sharma Covert, MD   4 mg at 12/11/19 2101   temazepam (RESTORIL) capsule 30 mg  30 mg Oral QHS Johnn Hai, MD   30 mg at 12/11/19 2101   traZODone (DESYREL) tablet 50 mg  50 mg Oral QHS PRN Anike, Adaku C, NP   50 mg at 12/08/19 0101    Lab Results: No results found for this or any previous visit (from the past 61 hour(s)).  Blood Alcohol level:  No results found for: [facility]  Metabolic Disorder Labs: No results found for: HGBA1C, MPG No results found for: PROLACTIN No results found for: CHOL, TRIG, HDL, CHOLHDL, VLDL, LDLCALC  Physical Findings: AIMS: Facial and Oral Movements Muscles of Facial Expression: None, normal Lips and Perioral Area: None, normal Jaw: None, normal Tongue: None, normal,Extremity Movements Upper (arms, wrists, hands, fingers): None, normal Lower (legs, knees, ankles, toes): None, normal, Trunk  Movements Neck, shoulders, hips: None, normal, Overall Severity Severity of abnormal movements (highest score from questions above): None, normal Incapacitation due to abnormal movements: None, normal Patient's awareness of abnormal movements (rate only patient's report): No Awareness, Dental Status Current problems with teeth and/or dentures?: No Does patient usually wear dentures?: No  CIWA:  CIWA-Ar Total: 0 COWS:  COWS Total Score: 2  Musculoskeletal: Strength & Muscle  Tone: within normal limits Gait & Station: normal Patient leans: N/A  Psychiatric Specialty Exam: Physical Exam  Review of Systems  Blood pressure 122/66, pulse 100, temperature (!) 97.4 F (36.3 C), temperature source Oral, resp. rate 20, height _0  (1.676 m), weight 92.1 kg, last menstrual period 11/08/2019, SpO2 100 %.Body mass index is 32.77 kg/m.  General Appearance: Disheveled  Eye Contact:  Good  Speech:  Slow and Slurred  Volume:  Decreased  Mood:   Hypomanic at baseline  Affect:  Blunt  Thought Process:  Linear denied auditory or visual hallucinations  Orientation:  Other:  Person place general situation  Thought Content:  Logical at intervals/some disjointed statements still  Suicidal Thoughts:  No  Homicidal Thoughts:  No  Memory:  NA  Judgement:  Impaired  Insight:  Lacking  Psychomotor Activity:  Normal  Concentration:  Concentration: Poor  Recall:  Poor  Fund of Knowledge:  Poor  Language:  Poor  Akathisia:  Negative  Handed:  Right  AIMS (if indicated):     Assets:  Leisure Time Physical Health  ADL's:  Intact  Cognition:  WNL  Sleep:  Number of Hours: 8.5     Treatment Plan Summary: Daily contact with patient to assess and evaluate symptoms and progress in treatment and Medication management  Continue to seek diagnostic clarity try to get a hold of family members patient states she has some phone numbers in her purse we do not know if this is accurate or not continue  antipsychotic therapy reality based therapy it is unclear what her baseline is without talking to family.  No change in meds today Discharge will be possible tomorrow if adequate living arrangements are met.   Trinna Post, Medical Student 12/12/2019, 8:47 AM

## 2019-12-13 MED ORDER — RISPERIDONE 2 MG PO TABS
4.0000 mg | ORAL_TABLET | Freq: Two times a day (BID) | ORAL | Status: DC
  Administered 2019-12-13 – 2019-12-14 (×3): 4 mg via ORAL
  Filled 2019-12-13 (×7): qty 2

## 2019-12-13 NOTE — Progress Notes (Signed)
Patient denies SI, HI and AVH this shift.   Patient has been attending groups, compliant with medications and engaged in therapy.  Patient has had no complaints this shift.   Assess patient for safety, offer medications as prescribed engage patient in 1:1 staff talks.   Continue to monitor as planned.  Patient able to contract for safety.

## 2019-12-13 NOTE — [facility] Group Notes (Signed)
LCSW Group Therapy Notes  Date and Time: 12/13/2019 @ 1:30pm  Type of Therapy and Topic: Group Therapy: Core Beliefs  Participation Level: [facility] PARTICIPATION LEVEL: Active  Description of Group: In this group patients will be encouraged to explore their negative and positive core beliefs about themselves, others, and the world. Each patient will be challenged to identify these beliefs and ways to challenge negative core beliefs. This group will be process-oriented, with patients participating in exploration of their own experiences as well as giving and receiving support and challenge from other group members.  Therapeutic Goals: 1. Patient will identify personal core beliefs, both negative and positive. 2. Patient will identify core beliefs relating to others, both negative and positive. 3. Patient will challenge their negative beliefs about themselves and others. 4. Patient will identify three changes they can make to replace negative core beliefs with positive beliefs.  Summary of Patient Progress  Due to the COVID-19 pandemic, this group has been supplemented with worksheets.  Therapeutic Modalities: Cognitive Behavioral Therapy Solution Focused Therapy Motivational Interviewing     Liya Strollo, MSW, LCSW  

## 2019-12-13 NOTE — Progress Notes (Signed)
   12/13/19 2000  Psych Admission Type (Psych Patients Only)  Admission Status Voluntary  Psychosocial Assessment  Patient Complaints None;Suspiciousness  Eye Contact Fair  Facial Expression Flat;Sad  Affect Appropriate to circumstance;Blunted;Preoccupied  Speech Tangential;Pressured;Rapid  Interaction Assertive  Motor Activity Slow  Appearance/Hygiene Disheveled;Poor hygiene;In scrubs  Behavior Characteristics Cooperative  Mood Suspicious  Thought Process  Coherency Disorganized;Loose associations;Tangential  Content Delusions;Preoccupation  Delusions Paranoid  Perception WDL  Hallucination None reported or observed  Judgment Poor  Confusion None  Danger to Self  Current suicidal ideation? Denies  Danger to Others  Danger to Others None reported or observed   Pt seen some on the unit this evening, pt continues to be suspicious, but pleasant this evening.

## 2019-12-13 NOTE — Tx Team (Signed)
Interdisciplinary Treatment and Diagnostic Plan Update  12/13/2019 Time of Session: 09:35am Brittany Valdez MRN: [id number]  Principal Diagnosis: <principal problem not specified>  Secondary Diagnoses: Active Problems:   Schizoaffective disorder, bipolar type ([place])   Current Medications:  Current Facility-Administered Medications  Medication Dose Route Frequency Provider Last Rate Last Admin  . acetaminophen (TYLENOL) tablet 650 mg  650 mg Oral Q6H PRN Emmaline Kluver, FNP   650 mg at 12/09/19 1112  . alum & mag hydroxide-simeth (MAALOX/MYLANTA) 200-200-20 MG/5ML suspension 30 mL  30 mL Oral Q4H PRN Emmaline Kluver, FNP      . benztropine (COGENTIN) tablet 0.5 mg  0.5 mg Oral BID Johnn Hai, MD   0.5 mg at 12/13/19 7829  . carbamazepine (TEGRETOL) chewable tablet 200 mg  200 mg Oral BID Sharma Covert, MD   200 mg at 12/13/19 5621  . cloNIDine (CATAPRES) tablet 0.2 mg  0.2 mg Oral Q4H PRN Johnn Hai, MD      . dicyclomine (BENTYL) tablet 20 mg  20 mg Oral TID PRN Johnn Hai, MD   20 mg at 12/12/19 1234  . gabapentin (NEURONTIN) capsule 300 mg  300 mg Oral TID Johnn Hai, MD   300 mg at 12/13/19 3086  . hydrOXYzine (ATARAX/VISTARIL) tablet 25 mg  25 mg Oral TID PRN Emmaline Kluver, FNP   25 mg at 12/13/19 0932  . magnesium hydroxide (MILK OF MAGNESIA) suspension 30 mL  30 mL Oral Daily PRN Emmaline Kluver, FNP      . neomycin-polymyxin-hydrocortisone (CORTISPORIN) OTIC (EAR) suspension 3 drop  3 drop Left EAR Q6H Sharma Covert, MD   3 drop at 12/13/19 0810  . nicotine (NICODERM CQ - dosed in mg/24 hours) patch 14 mg  14 mg Transdermal Daily Anike, Adaku C, NP   14 mg at 12/13/19 0811  . risperiDONE (RISPERDAL) tablet 4 mg  4 mg Oral BID Johnn Hai, MD   4 mg at 12/13/19 5784  . temazepam (RESTORIL) capsule 30 mg  30 mg Oral QHS Johnn Hai, MD   30 mg at 12/11/19 2101  . traZODone (DESYREL) tablet 50 mg  50 mg Oral QHS PRN Anike, Adaku C, NP   50 mg at 12/08/19 0101   PTA  Medications: Medications Prior to Admission  Medication Sig Dispense Refill Last Dose  . cyclobenzaprine (FLEXERIL) 10 MG tablet Take 1 tablet (10 mg total) by mouth 3 (three) times daily as needed for muscle spasms. 15 tablet 0   . gabapentin (NEURONTIN) 300 MG capsule Take 300 mg by mouth 3 (three) times daily.     Marland Kitchen HYDROcodone-acetaminophen (NORCO/VICODIN) 5-325 MG tablet Take 1 tablet by mouth every 6 (six) hours as needed for moderate pain. 15 tablet 0   . ibuprofen (ADVIL,MOTRIN) 200 MG tablet Take 800 mg by mouth every 6 (six) hours as needed for moderate pain.     Marland Kitchen ketorolac (TORADOL) 10 MG tablet Take 10 mg by mouth every 6 (six) hours as needed for moderate pain.     Marland Kitchen lidocaine (LIDODERM) 5 % Place 1 patch onto the skin every 12 (twelve) hours. Remove & Discard patch within 12 hours or as directed by MD     . metaxalone (SKELAXIN) 800 MG tablet Take 800 mg by mouth 3 (three) times daily as needed for muscle spasms.      . methylPREDNISolone (MEDROL) 4 MG tablet Take 4-24 mg by mouth daily. She took for 6 days and started it on 03/17/14.  She took six tablets on day 1, five tablets on day 2, four tablets on day 3, three tablets on day 4, two tablets on day 5 and one tablet on day 6. She completed the regimen yesterday (03/22/14).     Marland Kitchen oxyCODONE-acetaminophen (PERCOCET) 5-325 MG per tablet Take 1 tablet by mouth every 4 (four) hours as needed. 15 tablet 0   . oxyCODONE-acetaminophen (PERCOCET) 5-325 MG per tablet Take 1 tablet by mouth every 6 (six) hours as needed for moderate pain. 30 tablet 0   . predniSONE (DELTASONE) 50 MG tablet Take 1 tablet (50 mg total) by mouth daily with breakfast. 5 tablet 0   . RASPBERRY KETONES PO Take 2 tablets by mouth every morning.     . traMADol (ULTRAM) 50 MG tablet Take 50 mg by mouth every 6 (six) hours as needed for moderate pain.       Patient Stressors: Financial difficulties Health problems  Patient Strengths: Average or above average  intelligence Capable of independent living  Treatment Modalities: Medication Management, Group therapy, Case management,  1 to 1 session with clinician, Psychoeducation, Recreational therapy.   Physician Treatment Plan for Primary Diagnosis: <principal problem not specified> Long Term Goal(s): Improvement in symptoms so as ready for discharge Improvement in symptoms so as ready for discharge   Short Term Goals: Compliance with prescribed medications will improve Ability to identify and develop effective coping behaviors will improve  Medication Management: Evaluate patient's response, side effects, and tolerance of medication regimen.  Therapeutic Interventions: 1 to 1 sessions, Unit Group sessions and Medication administration.  Evaluation of Outcomes: Not Progressing  Physician Treatment Plan for Secondary Diagnosis: Active Problems:   Schizoaffective disorder, bipolar type ([facility])  Long Term Goal(s): Improvement in symptoms so as ready for discharge Improvement in symptoms so as ready for discharge   Short Term Goals: Compliance with prescribed medications will improve Ability to identify and develop effective coping behaviors will improve     Medication Management: Evaluate patient's response, side effects, and tolerance of medication regimen.  Therapeutic Interventions: 1 to 1 sessions, Unit Group sessions and Medication administration.  Evaluation of Outcomes: Not Progressing   RN Treatment Plan for Primary Diagnosis: <principal problem not specified> Long Term Goal(s): Knowledge of disease and therapeutic regimen to maintain health will improve  Short Term Goals: Ability to participate in decision making will improve, Ability to verbalize feelings will improve, Ability to disclose and discuss suicidal ideas, Ability to identify and develop effective coping behaviors will improve and Compliance with prescribed medications will improve  Medication Management: RN will  administer medications as ordered by provider, will assess and evaluate patient's response and provide education to patient for prescribed medication. RN will report any adverse and/or side effects to prescribing provider.  Therapeutic Interventions: 1 on 1 counseling sessions, Psychoeducation, Medication administration, Evaluate responses to treatment, Monitor vital signs and CBGs as ordered, Perform/monitor CIWA, COWS, AIMS and Fall Risk screenings as ordered, Perform wound care treatments as ordered.  Evaluation of Outcomes: Not Progressing   LCSW Treatment Plan for Primary Diagnosis: <principal problem not specified> Long Term Goal(s): Safe transition to appropriate next level of care at discharge, Engage patient in therapeutic group addressing interpersonal concerns.  Short Term Goals: Engage patient in aftercare planning with referrals and resources and Increase skills for wellness and recovery  Therapeutic Interventions: Assess for all discharge needs, 1 to 1 time with Social worker, Explore available resources and support systems, Assess for adequacy in community support network, Educate  family and significant other(s) on suicide prevention, Complete Psychosocial Assessment, Interpersonal group therapy.  Evaluation of Outcomes: Not Progressing   Progress in Treatment: Attending groups: No. Participating in groups: No. Taking medication as prescribed: Yes. Toleration medication: Yes. Family/Significant other contact made: No, will contact:  pt's mother Patient understands diagnosis: No. Discussing patient identified problems/goals with staff: Yes. Medical problems stabilized or resolved: Yes. Denies suicidal/homicidal ideation: Yes. Issues/concerns per patient self-inventory: No. Other:   New problem(s) identified: No, Describe:  None  New Short Term/Long Term Goal(s): Medication stabilization, elimination of SI thoughts, and development of a comprehensive mental wellness plan.    Patient Goals:  Patient was not able to articulate a goal.   Discharge Plan or Barriers: CSW has not been able to meet with the patient due to her disorganization of thoughts.   Reason for Continuation of Hospitalization: Delusions  Medication stabilization  Estimated Length of Stay: 1-2 days   Attendees: Patient: 12/13/2019   Physician: Dr. Malvin JohnsBrian Farah, MD 12/13/2019   Nursing: Luanne BrasRoni, RN  12/13/2019   RN Care Manager: 12/13/2019   Social Worker: Stephannie PetersJasmine Hila Bolding, LCSW 12/13/2019   Recreational Therapist:  12/13/2019   Other:  12/13/2019   Other:  12/13/2019  Other: 12/13/2019      Scribe for Treatment Team: Delphia GratesJasmine M Thao Bauza, LCSW 12/13/2019 9:39 AM

## 2019-12-13 NOTE — Progress Notes (Signed)
[facility] MD Progress Note  12/13/2019 7:24 AM Brittany Valdez  MRN:  [id number] Subjective:    Brittany Valdez presented with a psychotic condition, drug screen positive for amphetamines and disorganized thought and behavior.  We have been unable to contact any family members, number she gives [place] are not working members.  The patient is a little more organized she is oriented to person place and situation not date but she knows the month and year she continues to give me nonsensical statements, when asked her where she is going to leave she states "shelter or I have houses in [facility] but I do not go there because things happen" continues to be drug seeking asking for Adderall and stimulants which are not indicated.  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 Chronic neck pain     Past Surgical History:  Procedure Laterality Date  . CERVICAL SPINE SURGERY    . TUBAL LIGATION     Family History: History reviewed. No pertinent family history. Family Psychiatric  History: Unknown Social History:  Social History   Substance and Sexual Activity  Alcohol Use No     Social History   Substance and Sexual Activity  Drug Use No    Social History   Socioeconomic History  . Marital status: Married    Spouse name: Not on file  . Number of children: Not on file  . Years of education: Not on file  . Highest education level: Not on file  Occupational History  . Not on file  Tobacco Use  . Smoking status: Current Every Day Smoker    Packs/day: 0.50    Types: Cigarettes  . Smokeless tobacco: Never  Used  . Tobacco comment: during assessment pt states 1 p/day use  Substance and Sexual Activity  . Alcohol use: No  . Drug use: No  . Sexual activity: Yes    Birth control/protection: Surgical  Other Topics Concern  . Not on file  Social History Narrative  . Not on file   Social Determinants of Health   Financial Resource Strain:   . Difficulty of Paying Living Expenses: Not on file  Food Insecurity:   . Worried About Charity fundraiser in the Last Year: Not on file  . Ran Out of Food in the Last Year: Not on file  Transportation Needs:   . Lack of Transportation (Medical): Not on file  . Lack of Transportation (Non-Medical): Not on file  Physical Activity:   . Days of Exercise per Week: Not on file  . Minutes of Exercise per Session: Not on file  Stress:   . Feeling of Stress : Not on file  Social Connections:   . Frequency of Communication with Friends and Family: Not on file  . Frequency of Social Gatherings with Friends and Family: Not on file  . Attends Religious Services: Not on file  . Active Member of Clubs or Organizations: Not on file  . Attends Archivist Meetings: Not on file  . Marital Status: Not on file   Additional Social History:    Pain Medications: See MAR Prescriptions: See MAR Over the Counter: See  MAR History of alcohol / drug use?: Yes Name of Substance 1: Amphetamine 1 - Age of First Use: unknown 1 - Amount (size/oz): unknown 1 - Frequency: unknown 1 - Duration: unknown 1 - Last Use / Amount: unknown, labs positive on arrival to ED                  Sleep: Fair  Appetite:  Fair  Current Medications: Current Facility-Administered Medications  Medication Dose Route Frequency Provider Last Rate Last Admin  . acetaminophen (TYLENOL) tablet 650 mg  650 mg Oral Q6H PRN Patrcia Dollyate, Tina L, FNP   650 mg at 12/09/19 1112  . alum & mag hydroxide-simeth (MAALOX/MYLANTA) 200-200-20 MG/5ML suspension 30 mL  30 mL Oral Q4H PRN Patrcia Dollyate, Tina  L, FNP      . benztropine (COGENTIN) tablet 0.5 mg  0.5 mg Oral BID Malvin JohnsFarah, Jacqualynn Parco, MD   0.5 mg at 12/12/19 1657  . carbamazepine (TEGRETOL) chewable tablet 200 mg  200 mg Oral BID Antonieta Pertlary, Greg Lawson, MD   200 mg at 12/12/19 1657  . cloNIDine (CATAPRES) tablet 0.2 mg  0.2 mg Oral Q4H PRN Malvin JohnsFarah, Myeasha Ballowe, MD      . dicyclomine (BENTYL) tablet 20 mg  20 mg Oral TID PRN Malvin JohnsFarah, Drevion Offord, MD   20 mg at 12/12/19 1234  . gabapentin (NEURONTIN) capsule 300 mg  300 mg Oral TID Malvin JohnsFarah, Icholas Irby, MD   300 mg at 12/12/19 1657  . hydrOXYzine (ATARAX/VISTARIL) tablet 25 mg  25 mg Oral TID PRN Patrcia Dollyate, Tina L, FNP   25 mg at 12/11/19 1429  . magnesium hydroxide (MILK OF MAGNESIA) suspension 30 mL  30 mL Oral Daily PRN Patrcia Dollyate, Tina L, FNP      . neomycin-polymyxin-hydrocortisone (CORTISPORIN) OTIC (EAR) suspension 3 drop  3 drop Left EAR Q6H Antonieta Pertlary, Greg Lawson, MD   3 drop at 12/12/19 1810  . nicotine (NICODERM CQ - dosed in mg/24 hours) patch 14 mg  14 mg Transdermal Daily Anike, Adaku C, NP   14 mg at 12/12/19 0800  . risperiDONE (RISPERDAL) tablet 4 mg  4 mg Oral BID Malvin JohnsFarah, Lillias Difrancesco, MD      . temazepam (RESTORIL) capsule 30 mg  30 mg Oral QHS Malvin JohnsFarah, Nilaya Bouie, MD   30 mg at 12/11/19 2101  . traZODone (DESYREL) tablet 50 mg  50 mg Oral QHS PRN Anike, Adaku C, NP   50 mg at 12/08/19 0101    Lab Results: No results found for this or any previous visit (from the past 48 hour(s)).  Blood Alcohol level:  [facility] found [facility]: [facility]  Metabolic Disorder Labs: No results found for: HGBA1C, MPG No results found for: PROLACTIN No results found for: CHOL, TRIG, HDL, CHOLHDL, VLDL, LDLCALC  Physical Findings: AIMS: Facial and Oral Movements Muscles of Facial Expression: None, normal Lips and Perioral Area: None, normal Jaw: None, normal Tongue: None, normal,Extremity Movements Upper (arms, wrists, hands, fingers): None, normal Lower (legs, knees, ankles, toes): None, normal, Trunk Movements Neck, shoulders, hips: None, normal,  Overall Severity Severity of abnormal movements (highest score from questions above): None, normal Incapacitation due to abnormal movements: None, normal Patient's awareness of abnormal movements (rate only patient's report): No Awareness, Dental Status Current problems with teeth and/or dentures?: No Does patient usually wear dentures?: No  CIWA:  CIWA-Ar Total: 0 COWS:  COWS Total Score: 2  Musculoskeletal: Strength & Muscle Tone: within normal limits Gait & Station: normal Patient leans: N/A  Psychiatric Specialty Exam: Physical Exam  Review  of Systems  Blood pressure 124/74, pulse 97, temperature 97.6 F (36.4 C), temperature source Oral, resp. rate 20, height 5\' 6"  (1.676 m), weight 92.1 kg, last menstrual period 11/08/2019, SpO2 100 %.Body mass index is 32.77 kg/m.  General Appearance: Casual  Eye Contact:  Good  Speech:  Clear and Coherent  Volume:  Normal  Mood:  Euthymic  Affect:  Constricted  Thought Process:  Irrelevant and Descriptions of Associations: Circumstantial  Orientation:  Other:  Person place situation  Thought Content:  Illogical and Delusions-despite overall level of disorganization still has the mindfulness to be drug-seeking during multiple encounters  Suicidal Thoughts:  No  Homicidal Thoughts:  No  Memory:  Immediate;   Poor Recent;   Poor Remote;   Poor  Judgement:  Impaired  Insight:  Lacking  Psychomotor Activity:  Normal  Concentration:  Concentration: Poor and Attention Span: Fair  Recall:  11/10/2019 of Knowledge:  Fair  Language:  Fair  Akathisia:  Negative  Handed:  Right  AIMS (if indicated):     Assets:  [employer]  ADL's:  Intact  Cognition:  WNL  Sleep:  Number of Hours: 10.25     Treatment Plan Summary: Daily contact with patient to assess and evaluate symptoms and progress in treatment and Medication management  Continue but escalate slightly the Risperdal continue to try and reach some sort of  family or try and figure out some sort of housing for this individual no change in precautions continue reality based therapy this may be her baseline, 1 of intermittent disorganization but behavioral containment  Manufacturing systems engineer, MD 12/13/2019, 7:24 AM

## 2019-12-14 MED ORDER — TRAZODONE HCL 150 MG PO TABS: 150.0000 mg | ORAL_TABLET | Freq: Every evening | ORAL | 1 refills | Status: DC | PRN

## 2019-12-14 MED ORDER — RISPERIDONE 4 MG PO TABS: 8.0000 mg | ORAL_TABLET | Freq: Every day | ORAL | 2 refills | Status: DC

## 2019-12-14 MED ORDER — BENZTROPINE MESYLATE 1 MG PO TABS: 1.0000 mg | ORAL_TABLET | Freq: Two times a day (BID) | ORAL | 3 refills | Status: DC

## 2019-12-14 MED ORDER — CARBAMAZEPINE 100 MG PO CHEW: 200.0000 mg | CHEWABLE_TABLET | Freq: Two times a day (BID) | ORAL | 2 refills | Status: DC

## 2019-12-14 NOTE — [facility] Counselor (Signed)
CSW attempted 3 times to meet with patient for her assessment but was unable to do so because of altered mental status and disorganization. Patient still has altered mental status and unable to sit for assessment. Patient is being discharged today.

## 2019-12-14 NOTE — Progress Notes (Signed)
Patient ID: Brittany Valdez, female   DOB: May 29, 1981, 38 y.o.   MRN: [id number]   CSW scheduled kaizen lyft for 11:15am. CSW informed pt's nurse.

## 2019-12-14 NOTE — Progress Notes (Signed)
Recreation Therapy Notes  INPATIENT RECREATION TR PLAN  Patient Details Name: Brittany Valdez MRN: [id number] DOB: 09-Dec-1981 Today's Date: 12/14/2019  Rec Therapy Plan Is patient appropriate for Therapeutic Recreation?: Yes Treatment times per week: about 3 days Estimated Length of Stay: 5-7 days TR Treatment/Interventions: Group participation (Comment)  Discharge Criteria Pt will be discharged from therapy if:: Discharged Treatment plan/goals/alternatives discussed and agreed upon by:: Patient/family  Discharge Summary Short term goals set: See patient care plan Short term goals met: Adequate for discharge Progress toward goals comments: Groups attended Which groups?: Self-esteem, Other (Comment)(Team building) Reason goals not met: None Therapeutic equipment acquired: N/A Reason patient discharged from therapy: Discharge from hospital Pt/family agrees with progress & goals achieved: Yes Date patient discharged from therapy: 12/14/19    Victorino Sparrow, LRT/CTRS  Ria Comment, Aliscia Clayton A 12/14/2019, 11:00 AM

## 2019-12-14 NOTE — [facility] Suicide Risk Assessment (Signed)
[facility] Discharge Suicide Risk Assessment   Principal Problem: Drug-induced psychosis or drug-induced exacerbation of underlying psychotic disorder versus both Discharge Diagnoses: Active Problems:   Schizoaffective disorder, bipolar type ([place])   Total Time spent with patient: 45 minutes Musculoskeletal: Strength & Muscle Tone: within normal limits Gait & Station: normal Patient leans: N/A  Psychiatric Specialty Exam: Physical Exam  Review of Systems  Blood pressure 104/70, pulse 90, temperature 98 F (36.7 C), temperature source Oral, resp. rate 20, height 5\' 6"  (1.676 m), weight 92.1 kg, last menstrual period 11/08/2019, SpO2 100 %.Body mass index is 32.77 kg/m.  General Appearance: Fairly Groomed  Eye Contact:  Good  Speech:  Clear and Coherent  Volume:  Normal  Mood:  Euthymic  Affect:  Congruent  Thought Process:  Linear and Descriptions of Associations: Circumstantial  Orientation:  Full (Time, Place, and Person)  Thought Content:  Denies previously expressed delusional material to some extent still has some residual delusion about working with "sciences" but that said there is no hallucinations or thoughts of harming self or others and no other delusions present  Suicidal Thoughts:  No  Homicidal Thoughts:  No  Memory:  Immediate;   Poor Recent;   Poor Remote;   Poor  Judgement:  Fair  Insight:  Fair  Psychomotor Activity:  Normal  Concentration:  Attention Span: Fair  Recall:  [employer] of Knowledge:  Fair  Language:  Fair  Akathisia:  Negative  Handed:  Right  AIMS (if indicated):     Assets:  Physical Health Resilience  ADL's:  Intact  Cognition:  WNL  Sleep:  Number of Hours: 9.75   Mental Status Per Nursing Assessment::   On Admission:  NA  Demographic Factors:  Caucasian and Low socioeconomic status  Loss Factors: Decrease in vocational status  Historical Factors: NA  Risk Reduction Factors:   Sense of responsibility to family  Continued  Clinical Symptoms:  Alcohol/Substance Abuse/Dependencies  Cognitive Features That Contribute To Risk:  Loss of executive function    Suicide Risk:  Minimal: No identifiable suicidal ideation.  Patients presenting with no risk factors but with morbid ruminations; may be classified as minimal risk based on the severity of the depressive symptoms  Greatest risk to this patient is a nonintentional overdose  Plan Of Care/Follow-up recommendations:  Activity:  full  Kari Kerth, MD 12/14/2019, 8:10 AM

## 2019-12-14 NOTE — Progress Notes (Signed)
Patient ID: Brittany Valdez, female   DOB: 1981/08/12, 38 y.o.   MRN: [id number]  Patient denies SI, HI, and AVH upon discharge.  Patient discharged to home/self care on her own accord.  Patient acknowledges understanding of all discharge instructions and receipt of personal belongings. Patient eager to return home.

## 2019-12-14 NOTE — Plan of Care (Signed)
Pt was able to focus with more than 2 prompts from staff at completion of recreation therapy group sessions.   Victorino Sparrow, LRT/CTRS

## 2019-12-14 NOTE — Discharge Summary (Signed)
Physician Discharge Summary Note  Patient:  Brittany Valdez is an 38 y.o., female MRN:  [id number] DOB:  1981/05/26 Patient phone:  [phone] (home)  Patient address:   [address] [place] [address],  Total Time spent with patient: 45 minutes  Date of Admission:  12/07/2019 Date of Discharge: 12/14/2019  Reason for Admission:    History of Present Illness:   Brittany Valdez is 38 years of age and she presented under petition for involuntary commitment.  She had presented initially to [facility] on 12/16, after she was found by police standing in the rain, in near freezing temperatures, and speaking in nonsensical and/or delusional statements.  Her drug screen is positive for amphetamines only.  The patient is an unreliable historian due to the acuteness of her psychosis.  She tells me she suffers from "inflictions" when I ask her why she is in the hospital, she further states she "lives with a scientist who gives her chemicals, they are legal, I have an IQ of 1651, I was in 3 countries in 6 months with Ethelene Browns, the meds he injects me with are legal" she then asks "what county are we in?" and when I explained to her we are in [place] she states she is in a [place] hospital" but is unaware of day date and time.  She states she has been in "lots of hospitals" because of her "inflections and sex- people do things to me..." when asked if I can contact a family member or a spouse she states "we are not married, I do not have his number-I cannot remember numbers"  This mental status exam is consistent with the initial evaluation of 12/16 in which she was described as displaying "rapid tangential speech and speaking of others whom she is reluctant to name that are afflicting her" she also elaborated there is a scientist who lives in her home and "gives her medication that supposed to clean her body and soul".  Our medical records reflect that she has had cervical spine  surgery in 2015 with residual pain, and she was evaluated further in January 2017 at that point in time she was described as "recently diagnosed with schizophrenia" prescribed Risperdal at a very high dose of 8 mg a day and was complaining of side effects and mentioned a psychiatrist in [place] who was treating her but again other records are lost to our system, we have no collateral history, and the patient is an unreliable historian due to the acuteness of her psychosis.  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 Chronic neck pain     Past Surgical History:  Procedure Laterality Date  . CERVICAL SPINE SURGERY    . TUBAL LIGATION     Family History: History reviewed. No pertinent family history. Family Psychiatric  History: As above Social History:  Social History   Substance and Sexual Activity  Alcohol Use No  Social History   Substance and Sexual Activity  Drug Use No    Social History   Socioeconomic History  . Marital status: Married    Spouse name: Not on file  . Number of children: Not on file  . Years of education: Not on file  . Highest education level: Not on file  Occupational History  . Not on file  Tobacco Use  . Smoking status: Current Every Day Smoker    Packs/day: 0.50    Types: Cigarettes  .  Smokeless tobacco: Never Used  . Tobacco comment: during assessment pt states 1 p/day use  Substance and Sexual Activity  . Alcohol use: No  . Drug use: No  . Sexual activity: Yes    Birth control/protection: Surgical  Other Topics Concern  . Not on file  Social History Narrative  . Not on file   Social Determinants of Health   Financial Resource Strain:   . Difficulty of Paying Living Expenses: Not on file  Food Insecurity:   . Worried About Programme researcher, broadcasting/film/videounning Out of Food in the Last Year: Not on file  . Ran Out of Food in the Last Year: Not on file  Transportation Needs:   . Lack of Transportation (Medical): Not on file  . Lack of Transportation (Non-Medical): Not on file  Physical Activity:   . Days of Exercise per Week: Not on file  . Minutes of Exercise per Session: Not on file  Stress:   . Feeling of Stress : Not on file  Social Connections:   . Frequency of Communication with Friends and Family: Not on file  . Frequency of Social Gatherings with Friends and Family: Not on file  . Attends Religious Services: Not on file  . Active Member of Clubs or Organizations: Not on file  . Attends BankerClub or Organization Meetings: Not on file  . Marital Status: Not on file    Hospital Course:    As discussed Ms. Brittany Valdez presented in a quite disorganized state of mind with delusional believes, drug screen reflecting amphetamine abuse age and she was indeed drug-seeking during her hospital stay.  Her home med list included 2 different opiates, Ultram and Skelaxin as well as Flexeril and gabapentin but again her drug screen reflected the use of amphetamines and she was Adderall seeking during her hospital stay.  Her level of disorganization did improve we used high-dose Risperdal, monitored for withdrawal, monitor for signs or symptoms of cardiac toxicity, and gave her a sleep aid.  We also used carbamazepine to help ameliorate possible withdrawal symptoms and stabilize mood.  She continues to  report that she lives with a "scientist" who gave her drugs but this was a delusional belief and we did not see a full resolution of all of her delusions but on the date of the 24th she reported that she "work with a Chiropodistscientist" but they did not live with her and did not give her chemicals.  So she had gradually improved but we never achieved very clear diagnostic understandings of her case but my best guess is the conflation of a probable underlying psychotic disorder, exacerbated or induced by opiate dependency and amphetamine usage, led to this presentation of disorganization.  Her baseline is generally unknown to us and we were never able to reach any family members.  Despite repeated attempts we really could not get a specific address from her but she stated she knew where to tile a driver to go she could not provide an  exact address of her home but she could direct the driver where to drop her off and she stated she would stay with her father.  Since she had no thoughts of harming self or others no withdrawal symptoms and psychosis had resolved, no acute intoxication withdrawal from compounds she was released home today  Physical Findings: AIMS: Facial and Oral Movements Muscles of Facial Expression: None, normal Lips and Perioral Area: None, normal Jaw: None, normal Tongue: None, normal,Extremity Movements Upper (arms, wrists, hands, fingers): None, normal Lower (legs, knees, ankles, toes): None, normal, Trunk Movements Neck, shoulders, hips: None, normal, Overall Severity Severity of abnormal movements (highest score from questions above): None, normal Incapacitation due to abnormal movements: None, normal Patient's awareness of abnormal movements (rate only patient's report): No Awareness, Dental Status Current problems with teeth and/or dentures?: No Does patient usually wear dentures?: No  CIWA:  CIWA-Ar Total: 0 COWS:  COWS Total Score: 2  Musculoskeletal: Strength & Muscle Tone:  within normal limits Gait & Station: normal Patient leans: N/A  Psychiatric Specialty Exam: Physical Exam  Review of Systems  Blood pressure 104/70, pulse 90, temperature 98 F (36.7 C), temperature source Oral, resp. rate 20, height 5\' 6"  (1.676 m), weight 92.1 kg, last menstrual period 11/08/2019, SpO2 100 %.Body mass index is 32.77 kg/m.  General Appearance: Fairly Groomed  Eye Contact:  Good  Speech:  Clear and Coherent  Volume:  Normal  Mood:  Euthymic  Affect:  Congruent  Thought Process:  Linear and Descriptions of Associations: Circumstantial  Orientation:  Full (Time, Place, and Person)  Thought Content:  Denies previously expressed delusional material to some extent still has some residual delusion about working with "sciences" but that said there is no hallucinations or thoughts of harming self or others and no other delusions present  Suicidal Thoughts:  No  Homicidal Thoughts:  No  Memory:  Immediate;   Poor Recent;   Poor Remote;   Poor  Judgement:  Fair  Insight:  Fair  Psychomotor Activity:  Normal  Concentration:  Attention Span: Fair  Recall:  11/10/2019 of Knowledge:  Fair  Language:  Fair  Akathisia:  Negative  Handed:  Right  AIMS (if indicated):     Assets:  Physical Health Resilience  ADL's:  Intact  Cognition:  WNL  Sleep:  Number of Hours: 9.75     Have you used any form of tobacco in the last 30 days? (Cigarettes, Smokeless Tobacco, Cigars, and/or Pipes): Yes  Has this patient used any form of tobacco in the last 30 days? (Cigarettes, Smokeless Tobacco, Cigars, and/or Pipes) Yes, No  Blood Alcohol level:  No results found for: [facility]  Metabolic Disorder Labs:  No results found for: HGBA1C, MPG No results found for: PROLACTIN No results found for: CHOL, TRIG, HDL, CHOLHDL, VLDL, LDLCALC  See Psychiatric Specialty Exam and Suicide Risk Assessment completed by Attending Physician prior to discharge.  Discharge destination:  Home  Is patient  on multiple antipsychotic therapies at discharge:  No   Has Patient had three or more failed trials of antipsychotic monotherapy by history:  No  Recommended Plan for Multiple Antipsychotic Therapies: NA   Allergies as of 12/14/2019   No Known Allergies     Medication List    STOP taking these medications   cyclobenzaprine 10 MG tablet Commonly known as: FLEXERIL   gabapentin 300 MG capsule Commonly known as: NEURONTIN   HYDROcodone-acetaminophen 5-325 MG tablet Commonly known as: NORCO/VICODIN   ibuprofen  200 MG tablet Commonly known as: ADVIL   ketorolac 10 MG tablet Commonly known as: TORADOL   lidocaine 5 % Commonly known as: LIDODERM   Medrol 4 MG tablet Generic drug: methylPREDNISolone   metaxalone 800 MG tablet Commonly known as: SKELAXIN   oxyCODONE-acetaminophen 5-325 MG tablet Commonly known as: Percocet   predniSONE 50 MG tablet Commonly known as: DELTASONE   traMADol 50 MG tablet Commonly known as: ULTRAM     TAKE these medications     Indication  benztropine 1 MG tablet Commonly known as: COGENTIN Take 1 tablet (1 mg total) by mouth 2 (two) times daily.  Indication: Extrapyramidal Reaction caused by Medications   carbamazepine 100 MG chewable tablet Commonly known as: TEGRETOL Chew 2 tablets (200 mg total) by mouth 2 (two) times daily.  Indication: Schizophrenia that does Not Respond to Usual Drug Therapy   RASPBERRY KETONES PO Take 2 tablets by mouth every morning.  Indication: 21-Hydroxylase Deficiency   risperidone 4 MG tablet Commonly known as: RISPERDAL Take 2 tablets (8 mg total) by mouth at bedtime.  Indication: Hypomanic Episode of Bipolar Disorder   traZODone 150 MG tablet Commonly known as: DESYREL Take 1 tablet (150 mg total) by mouth at bedtime as needed for sleep.  Indication: Trouble Sleeping       SignedJohnn Hai, MD 12/14/2019, 8:04 AM

## 2019-12-14 NOTE — Progress Notes (Signed)
  [facility] Adult Case Management Discharge Plan :  Will you be returning to the same living situation after discharge:  No.; Patient is going to go live with her father  At discharge, do you have transportation home?: Yes,  [employer] Do you have the ability to pay for your medications: Yes,  [employer] medicare  Release of information consent forms completed and in the chart;  Patient's signature needed at discharge.  Patient to Follow up at: Follow-up [place] Follow up.   Why: You will receive a phone call from a social worker on 12/28 to inform you of your appointment for medication management.  Contact information: [place] [address] [phone]           Next level of care provider has access to [place] and Suicide Prevention discussed: No.; CSW attempted to contact pt's mother twice  Have you used any form of tobacco in the last 30 days? (Cigarettes, Smokeless Tobacco, Cigars, and/or Pipes): Yes  Has patient been referred to the Quitline?: Patient refused referral  Patient has been referred for addiction treatment: Yes  Trecia Rogers, LCSW 12/14/2019, 10:04 AM

## 2019-12-14 NOTE — [facility] Suicide Risk Assessment (Signed)
[place] INPATIENT:  Family/Significant Other Suicide Prevention Education  Suicide Prevention Education:  Contact Attempts: Pt's mother, Brittany Valdez,  has been identified by the patient as the family member/significant other with whom the patient will be residing, and identified as the person(s) who will aid the patient in the event of a mental health crisis.  With written consent from the patient, two attempts were made to provide suicide prevention education, prior to and/or following the patient's discharge.  We were unsuccessful in providing suicide prevention education.  A suicide education pamphlet was given to the patient to share with family/significant other.  Date and time of first attempt: 12/22 @ 1:20pm Date and time of second attempt: 12/24 @ 10:00am  Trecia Rogers 12/14/2019, 10:02 AM

## 2019-12-14 NOTE — Progress Notes (Signed)
Recreation Therapy Notes  Date: 12.24.20 Time: 0950 Location: [address] Dayroom  Group Topic: Communication, Team Building, Problem Solving  Goal Area(s) Addresses:  Patient will effectively work with peer towards shared goal.  Patient will identify skill used to make activity successful.  Patient will identify how skills used during activity can be used to reach post d/c goals.   Behavioral Response: None  Intervention: STEM Activity   Activity: [employer]. Patients were provided the following materials: 5 drinking straws, 5 rubber bands, 5 paper clips, 2 index cards, and 2 drinking cups. Using the provided materials patients were asked to build a launching mechanisms to launch a ping pong ball approximately 12 feet. Patients were divided into teams of 3-5.   Education: Education officer, community, Dentist.   Education Outcome: Acknowledges education/In group clarification offered/Needs additional education.   Clinical Observations/Feedback: Pt sat and observed.  Pt appeared flat.  Pt left early and did not return.    Victorino Sparrow, LRT/CTRS     Victorino Sparrow A 12/14/2019 10:41 AM

## 2020-01-12 ENCOUNTER — Other Ambulatory Visit: Payer: Self-pay

## 2020-01-12 DIAGNOSIS — Z20822 Contact with and (suspected) exposure to covid-19: Secondary | ICD-10-CM

## 2020-01-13 LAB — NOVEL CORONAVIRUS, NAA: SARS-CoV-2, NAA: NOT DETECTED

## 2020-02-09 ENCOUNTER — Emergency Department (HOSPITAL_COMMUNITY): Payer: Medicare HMO

## 2020-02-09 ENCOUNTER — Encounter (HOSPITAL_COMMUNITY): Payer: Self-pay

## 2020-02-09 ENCOUNTER — Other Ambulatory Visit: Payer: Self-pay

## 2020-02-09 ENCOUNTER — Emergency Department (HOSPITAL_COMMUNITY)
Admission: EM | Admit: 2020-02-09 | Discharge: 2020-02-10 | Disposition: A | Discharge status: Other Acute Inpatient Hospital | Payer: Medicare HMO | Attending: Emergency Medicine | Admitting: Emergency Medicine

## 2020-02-09 DIAGNOSIS — F17210 Nicotine dependence, cigarettes, uncomplicated: Secondary | ICD-10-CM | POA: Insufficient documentation

## 2020-02-09 DIAGNOSIS — F250 Schizoaffective disorder, bipolar type: Secondary | ICD-10-CM | POA: Insufficient documentation

## 2020-02-09 DIAGNOSIS — M79605 Pain in left leg: Secondary | ICD-10-CM | POA: Insufficient documentation

## 2020-02-09 DIAGNOSIS — M545 Low back pain, unspecified: Secondary | ICD-10-CM

## 2020-02-09 DIAGNOSIS — F22 Delusional disorders: Secondary | ICD-10-CM | POA: Insufficient documentation

## 2020-02-09 DIAGNOSIS — M79604 Pain in right leg: Secondary | ICD-10-CM | POA: Diagnosis not present

## 2020-02-09 DIAGNOSIS — Z20822 Contact with and (suspected) exposure to covid-19: Secondary | ICD-10-CM | POA: Insufficient documentation

## 2020-02-09 LAB — COMPREHENSIVE METABOLIC PANEL
ALT: 12 U/L (ref 0–44)
AST: 12 U/L — ABNORMAL LOW (ref 15–41)
Albumin: 3.5 g/dL (ref 3.5–5.0)
Alkaline Phosphatase: 44 U/L (ref 38–126)
Anion gap: 9 (ref 5–15)
BUN: 6 mg/dL (ref 6–20)
CO2: 23 mmol/L (ref 22–32)
Calcium: 8.7 mg/dL — ABNORMAL LOW (ref 8.9–10.3)
Chloride: 105 mmol/L (ref 98–111)
Creatinine, Ser: 0.63 mg/dL (ref 0.44–1.00)
GFR calc Af Amer: >60 mL/min (ref >60)
GFR calc non Af Amer: >60 mL/min (ref >60)
Glucose, Bld: 88 mg/dL (ref 70–99)
Potassium: 3.9 mmol/L (ref 3.5–5.1)
Sodium: 137 mmol/L (ref 135–145)
Total Bilirubin: 0.3 mg/dL (ref 0.3–1.2)
Total Protein: 6.3 g/dL — ABNORMAL LOW (ref 6.5–8.1)

## 2020-02-09 LAB — CBC WITH DIFFERENTIAL/PLATELET
Abs Immature Granulocytes: 0.02 10*3/uL (ref 0.00–0.07)
Basophils Absolute: 0 10*3/uL (ref 0.0–0.1)
Basophils Relative: 0 %
Eosinophils Absolute: 0.1 10*3/uL (ref 0.0–0.5)
Eosinophils Relative: 1 %
HCT: 40.6 % (ref 36.0–46.0)
Hemoglobin: 13.1 g/dL (ref 12.0–15.0)
Immature Granulocytes: 0 %
Lymphocytes Relative: 42 %
Lymphs Abs: 2.9 10*3/uL (ref 0.7–4.0)
MCH: 29.9 pg (ref 26.0–34.0)
MCHC: 32.3 g/dL (ref 30.0–36.0)
MCV: 92.7 fL (ref 80.0–100.0)
Monocytes Absolute: 0.4 10*3/uL (ref 0.1–1.0)
Monocytes Relative: 5 %
Neutro Abs: 3.6 10*3/uL (ref 1.7–7.7)
Neutrophils Relative %: 52 %
Platelets: 281 10*3/uL (ref 150–400)
RBC: 4.38 MIL/uL (ref 3.87–5.11)
RDW: 14.4 % (ref 11.5–15.5)
WBC: 7 10*3/uL (ref 4.0–10.5)
nRBC: 0 % (ref 0.0–0.2)

## 2020-02-09 LAB — ETHANOL: Alcohol, Ethyl (B): 14 mg/dL — ABNORMAL HIGH (ref <10)

## 2020-02-09 LAB — I-STAT BETA HCG BLOOD, ED (MC, WL, AP ONLY): I-stat hCG, quantitative: <5 m[IU]/mL (ref <5)

## 2020-02-09 LAB — ACETAMINOPHEN LEVEL: Acetaminophen (Tylenol), Serum: <10 ug/mL — ABNORMAL LOW (ref 10–30)

## 2020-02-09 LAB — SALICYLATE LEVEL: Salicylate Lvl: <7 mg/dL — ABNORMAL LOW (ref 7.0–30.0)

## 2020-02-09 MED ORDER — HYDROCODONE-ACETAMINOPHEN 5-325 MG PO TABS
1.0000 | ORAL_TABLET | Freq: Once | ORAL | Status: AC
  Administered 2020-02-10: 1 via ORAL
  Filled 2020-02-09: qty 1

## 2020-02-09 MED ORDER — HYDROXYZINE HCL 25 MG PO TABS
50.0000 mg | ORAL_TABLET | Freq: Once | ORAL | Status: AC
  Administered 2020-02-09: 50 mg via ORAL
  Filled 2020-02-09: qty 2

## 2020-02-09 NOTE — ED Provider Notes (Signed)
Received sign out from previous provider, please see his note for complete H&P.  39 year old female with hx of schizophrenia presenting with back and lower extremities pain as well as anxiety.  Pt appears to exhibits sxs suggestive of active schizophrenic disorder complicated by polysubstance use.  No SI/HI.  Pt is medically cleared.  TTS and Psych have evaluated pt and recommends continued observation for safety and stabilization and to be reassessed in the morning by psych.  Pt currently resting.   BP (!) 91/57 (BP Location: Right Arm)   Pulse 67   Temp (!) 97.5 F (36.4 C) (Oral)   Resp 18   SpO2 96%   Results for orders placed or performed during the hospital encounter of 02/09/20  Comprehensive metabolic panel  Result Value Ref Range   Sodium 137 135 - 145 mmol/L   Potassium 3.9 3.5 - 5.1 mmol/L   Chloride 105 98 - 111 mmol/L   CO2 23 22 - 32 mmol/L   Glucose, Bld 88 70 - 99 mg/dL   BUN 6 6 - 20 mg/dL   Creatinine, Ser 0.09 0.44 - 1.00 mg/dL   Calcium 8.7 (L) 8.9 - 10.3 mg/dL   Total Protein 6.3 (L) 6.5 - 8.1 g/dL   Albumin 3.5 3.5 - 5.0 g/dL   AST 12 (L) 15 - 41 U/L   ALT 12 0 - 44 U/L   Alkaline Phosphatase 44 38 - 126 U/L   Total Bilirubin 0.3 0.3 - 1.2 mg/dL   GFR calc non Af Amer >60 >60 mL/min   GFR calc Af Amer >60 >60 mL/min   Anion gap 9 5 - 15  Ethanol  Result Value Ref Range   Alcohol, Ethyl (B) 14 (H) <10 mg/dL  CBC with Diff  Result Value Ref Range   WBC 7.0 4.0 - 10.5 K/uL   RBC 4.38 3.87 - 5.11 MIL/uL   Hemoglobin 13.1 12.0 - 15.0 g/dL   HCT 23.3 00.7 - 62.2 %   MCV 92.7 80.0 - 100.0 fL   MCH 29.9 26.0 - 34.0 pg   MCHC 32.3 30.0 - 36.0 g/dL   RDW 63.3 35.4 - 56.2 %   Platelets 281 150 - 400 K/uL   nRBC 0.0 0.0 - 0.2 %   Neutrophils Relative % 52 %   Neutro Abs 3.6 1.7 - 7.7 K/uL   Lymphocytes Relative 42 %   Lymphs Abs 2.9 0.7 - 4.0 K/uL   Monocytes Relative 5 %   Monocytes Absolute 0.4 0.1 - 1.0 K/uL   Eosinophils Relative 1 %   Eosinophils  Absolute 0.1 0.0 - 0.5 K/uL   Basophils Relative 0 %   Basophils Absolute 0.0 0.0 - 0.1 K/uL   Immature Granulocytes 0 %   Abs Immature Granulocytes 0.02 0.00 - 0.07 K/uL  Acetaminophen level  Result Value Ref Range   Acetaminophen (Tylenol), Serum <10 (L) 10 - 30 ug/mL  Salicylate level  Result Value Ref Range   Salicylate Lvl <7.0 (L) 7.0 - 30.0 mg/dL  I-Stat beta hCG blood, ED  Result Value Ref Range   I-stat hCG, quantitative <5.0 <5 mIU/mL   Comment 3           DG Lumbar Spine Complete  Result Date: 02/09/2020 CLINICAL DATA:  Lumbosacral back pain. No known injury. EXAM: LUMBAR SPINE - COMPLETE 4+ VIEW COMPARISON:  None. FINDINGS: The alignment is maintained. Vertebral body heights are normal. There is no listhesis. The posterior elements are intact. Disc space  narrowing and endplate spurring at U3-Y3. Additional mild endplate spurring with preservation of disc spaces at multiple levels. No fracture. Sacroiliac joints are symmetric and normal. IMPRESSION: Mild multilevel spondylosis, most significant at L2-L3 with degenerative disc disease. Electronically Signed   By: Keith Rake M.D.   On: 02/09/2020 21:49   DG Sacrum/Coccyx  Result Date: 02/09/2020 CLINICAL DATA:  Lumbosacral back pain. No known injury. EXAM: SACRUM AND COCCYX - 2+ VIEW COMPARISON:  None. FINDINGS: Cortical margins of the sacrum and coccyx are intact. Sacral ala are maintained. There is no evidence of fracture or other focal bone lesions. IMPRESSION: Negative radiographs of the sacrum and coccyx. Electronically Signed   By: Keith Rake M.D.   On: 02/09/2020 21:49       Domenic Moras, PA-C 02/10/20 0406    Ripley Fraise, MD 02/11/20 [phone]

## 2020-02-09 NOTE — Discharge Instructions (Addendum)
Please follow-up with psychiatry.  I have included some recommendations for outpatient resources.  Also recommend that you follow-up with primary care doctor.  If you do not have 1 then I recommend a follow-up with the [facility].  I have included this admission for you.

## 2020-02-09 NOTE — ED Notes (Signed)
Pt changed in purple scrubs, 2 belonging bags in green zone currently.

## 2020-02-09 NOTE — ED Provider Notes (Signed)
[facility] EMERGENCY DEPARTMENT Provider Note   CSN: [id number] Arrival date & time: 02/09/20  1812     History Chief Complaint  Patient presents with  . Leg Pain  . Back Pain  . Stress    Brittany Valdez is a 39 y.o. female.  HPI Patient is a 39 year old female with a history of paranoid schizophrenia, chronic neck pain, recent [facility] [facility] hospitalization.  Patient presents today with chief complaints of bilateral lower extremity pain, low back pain and anxiety.  Patient states that she has had low back pain and lower extremity pain for 6 months.  States it has not changed in character and states it is constant achy and moderate/severe--although notably she told triage that it was 9/10.   Patient states that she is on no medications currently for anxiety, stress, psychosis.  She states she has had a "brain development pill "but cannot recall the name.  She states that she does not take any antipsychotics.  Patient states that she does not see a therapist for psychiatrist.  She states she lives at home with multiple people with the same name.  She is unable to give any phone numbers for people she lives with.  Patient denies any behavioral health hospitalization in the past however when I brought up with her that she was recently hospitalized for schizophrenia she says that she was in the hospital "for a short time ".    Patient has numerous times for benzodiazepine medication.  She does not have a prescription for this but states that she has something for her nerves.  Patient states that she is a subject of scientific research.  And states that her brain development medications is part of to this research.  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  It appears that she dramatically improved with Risperdal and was discharged home  because she appeared to have improvement in her psychosis and delusions.    Past Medical History:  Diagnosis Date  . Chronic neck pain     Patient Active Problem List   Diagnosis Date Noted  . Schizoaffective disorder, bipolar type ([place]) 12/08/2019    Past Surgical History:  Procedure Laterality Date  . CERVICAL SPINE SURGERY    . TUBAL LIGATION       OB History   No obstetric history on file.     No family history on file.  Social History   Tobacco Use  . Smoking status: Current Every Day Smoker    Packs/day: 0.50    Types: Cigarettes  . Smokeless tobacco: Never Used  . Tobacco comment: during assessment pt states 1 p/day use  Substance Use Topics  . Alcohol use: No  . Drug use: No    Home Medications Prior to Admission medications   Medication Sig Start Date End Date Taking? Authorizing Provider  benztropine (COGENTIN) 1 MG tablet Take 1 tablet (1 mg total) by mouth 2 (two) times daily. Patient not taking: Reported on 02/09/2020 12/14/19   Johnn Hai, MD  carbamazepine (TEGRETOL) 100 MG chewable tablet Chew 2 tablets (200 mg total) by mouth 2 (two) times daily. Patient not taking: Reported on 02/09/2020 12/14/19   Johnn Hai, MD  risperiDONE (RISPERDAL) 4 MG tablet Take 2 tablets (8 mg total) by mouth at bedtime. Patient not taking: Reported on 02/09/2020 12/14/19   Johnn Hai, MD  traZODone (DESYREL) 150 MG  tablet Take 1 tablet (150 mg total) by mouth at bedtime as needed for sleep. Patient not taking: Reported on 02/09/2020 12/14/19   Malvin Johns, MD    Allergies    Patient has no known allergies.  Review of Systems   Review of Systems  Constitutional: Negative for chills and fever.  HENT: Negative for congestion.   Eyes: Negative for pain.  Respiratory: Negative for cough and shortness of breath.   Cardiovascular: Negative for chest pain and leg swelling.  Gastrointestinal: Negative for abdominal pain and vomiting.  Genitourinary: Negative for  dysuria.  Musculoskeletal: Positive for back pain. Negative for myalgias.       Bilateral leg pain  Skin: Negative for rash.  Neurological: Negative for dizziness and headaches.  Psychiatric/Behavioral:       Anxiety, agitation, difficulty sleeping    Physical Exam Updated Vital Signs BP (!) 100/55 (BP Location: Left Arm)   Pulse 67   Temp (!) 97.5 F (36.4 C) (Oral)   Resp 20   SpO2 100%   Physical Exam Vitals and nursing note reviewed.  Constitutional:      General: She is not in acute distress. HENT:     Head: Normocephalic and atraumatic.     Nose: Nose normal.  Eyes:     General: No scleral icterus. Cardiovascular:     Rate and Rhythm: Normal rate and regular rhythm.     Pulses: Normal pulses.     Heart sounds: Normal heart sounds.  Pulmonary:     Effort: Pulmonary effort is normal. No respiratory distress.     Breath sounds: No wheezing.  Abdominal:     Palpations: Abdomen is soft.     Tenderness: There is no abdominal tenderness.  Musculoskeletal:     Cervical back: Normal range of motion.     Right lower leg: No edema.     Left lower leg: No edema.     Comments: F ROM of bilateral legs.  Specific tenderness to palpation.  No calf tenderness or swelling.  No cellulitis or erythema.  No bruising step-off or deformity.  Strength 5/5 bilateral lower extremities with hip flexion-extension, knee flexion surgery, ankle plantarflexion dorsiflexion.  Mild midline tenderness to palpation of the lumbar spine.  Skin:    General: Skin is warm and dry.     Capillary Refill: Capillary refill takes less than 2 seconds.  Neurological:     Mental Status: She is alert. Mental status is at baseline.     Comments: Sensation intact bilateral lower extremities.  Patient is ambulatory with no abnormalities of gait.  Bilateral patellar reflexes intact.  Psychiatric:     Comments: Strange/bizarre affect, flight of ideas, tangential speech.     ED Results / Procedures /  Treatments   Labs (all labs ordered are listed, but only abnormal results are displayed) Labs Reviewed  COMPREHENSIVE METABOLIC PANEL - Abnormal; Notable for the following components:      Result Value   Calcium 8.7 (*)    Total Protein 6.3 (*)    AST 12 (*)    All other components within normal limits  ETHANOL - Abnormal; Notable for the following components:   Alcohol, Ethyl (B) 14 (*)    All other components within normal limits  ACETAMINOPHEN LEVEL - Abnormal; Notable for the following components:   Acetaminophen (Tylenol), Serum <10 (*)    All other components within normal limits  SALICYLATE LEVEL - Abnormal; Notable for the following components:   Salicylate Lvl <7.0 (*)  All other components within normal limits  CBC WITH DIFFERENTIAL/PLATELET  RAPID URINE DRUG SCREEN, HOSP PERFORMED  URINALYSIS, COMPLETE (UACMP) WITH MICROSCOPIC  I-STAT BETA HCG BLOOD, ED ([facility], [facility], [facility] ONLY)    EKG None  Radiology DG Lumbar Spine Complete  Result Date: 02/09/2020 CLINICAL DATA:  Lumbosacral back pain. No known injury. EXAM: LUMBAR SPINE - COMPLETE 4+ VIEW COMPARISON:  None. FINDINGS: The alignment is maintained. Vertebral body heights are normal. There is no listhesis. The posterior elements are intact. Disc space narrowing and endplate spurring at L2-L3. Additional mild endplate spurring with preservation of disc spaces at multiple levels. No fracture. Sacroiliac joints are symmetric and normal. IMPRESSION: Mild multilevel spondylosis, most significant at L2-L3 with degenerative disc disease. Electronically Signed   By: Narda Rutherford M.D.   On: 02/09/2020 21:49   DG Sacrum/Coccyx  Result Date: 02/09/2020 CLINICAL DATA:  Lumbosacral back pain. No known injury. EXAM: SACRUM AND COCCYX - 2+ VIEW COMPARISON:  None. FINDINGS: Cortical margins of the sacrum and coccyx are intact. Sacral ala are maintained. There is no evidence of fracture or other focal bone lesions. IMPRESSION: Negative  radiographs of the sacrum and coccyx. Electronically Signed   By: Narda Rutherford M.D.   On: 02/09/2020 21:49    Procedures Procedures (including critical care time)  Medications Ordered in ED Medications  HYDROcodone-acetaminophen (NORCO/VICODIN) 5-325 MG per tablet 1 tablet (has no administration in time range)  hydrOXYzine (ATARAX/VISTARIL) tablet 50 mg (50 mg Oral Given 02/09/20 2107)    ED Course  I have reviewed the triage vital signs and the nursing notes.  Pertinent labs & imaging results that were available during my care of the patient were reviewed by me and considered in my medical decision making (see chart for details).    MDM Rules/Calculators/A&P                      Patient is 39 year old female with a history of paranoid schizophrenia recent behavioral health hospitalization for paranoid delusions presented today with anxiety, back pain, bilateral leg pain and my examination espouses numerous paranoid delusions such as she has been studied by brain scientist.  For her back pain her symptoms appear to be chronic and they have been ongoing for over 6 months.  Her physical exam is unremarkable.  She has known terms of elevation of the midline spine however she also has some muscular tenderness of the paralumbar muscles.  Plain films of lumbar sacral spine obtained.  I individually reviewed x-ray films and interpreted results.  There is evidence of mild degenerative disease of the lumbosacral spine.  No spondylolisthesis or fracture.  There is some spondylosis.  No acute abnormalities.  Because of patient's history of paranoid delusions and schizophrenia which has been drug induced in the past I obtain urine drug screen.  Also obtain basic laboratory patient pending possibly of your health admission.  Tylenol and salicylate level within normal limits.  Mildly elevated alcohol consistent with patient having a small amount of alcohol in her system.  CBC and CMP unremarkable.   I-STAT hCG negative.  Urinalysis / urine drug screen pending at time of shift change.  TTS consult pending.  Patient decided to be IVC this time.  If she decides she would like to leave she is not require IVC.  Patient care transferred to evaluate Laveda Norman at 11:20 PM.  He will follow-up on behavioral health recommendations.  Patient is resting comfortably in bed in his work shift change.  Final Clinical Impression(s) / ED Diagnoses Final diagnoses:  Acute midline low back pain without sciatica  Bilateral leg pain  Delusion (HCC)  Paranoid [facility])    Rx / DC Orders ED Discharge Orders    None       Gailen Shelter, [place] 02/09/20 2322    Virgina Norfolk, DO 02/10/20 1509

## 2020-02-09 NOTE — ED Notes (Signed)
Taken to XRAY

## 2020-02-09 NOTE — ED Triage Notes (Signed)
Pt reports generalized pain in her legs and lower back, states she is going through a lot right now. States "I just need like a klonopin or something" Pt has apt with PCP later this month but states she could not wait because of the pain. Pt ambulatory, nad noted

## 2020-02-10 ENCOUNTER — Encounter (HOSPITAL_COMMUNITY): Payer: Self-pay | Admitting: Psychiatry

## 2020-02-10 ENCOUNTER — Inpatient Hospital Stay (HOSPITAL_COMMUNITY)
Admission: AD | Admit: 2020-02-10 | Discharge: 2020-02-13 | DRG: 885 | Disposition: A | Discharge status: Home / Self Care | Payer: Medicare HMO | Source: Intra-hospital | Attending: Psychiatry | Admitting: Psychiatry

## 2020-02-10 DIAGNOSIS — F419 Anxiety disorder, unspecified: Secondary | ICD-10-CM | POA: Diagnosis present

## 2020-02-10 DIAGNOSIS — G4700 Insomnia, unspecified: Secondary | ICD-10-CM | POA: Diagnosis present

## 2020-02-10 DIAGNOSIS — Z20822 Contact with and (suspected) exposure to covid-19: Secondary | ICD-10-CM | POA: Diagnosis present

## 2020-02-10 DIAGNOSIS — F15159 Other stimulant abuse with stimulant-induced psychotic disorder, unspecified: Secondary | ICD-10-CM | POA: Diagnosis present

## 2020-02-10 DIAGNOSIS — F17210 Nicotine dependence, cigarettes, uncomplicated: Secondary | ICD-10-CM | POA: Diagnosis present

## 2020-02-10 DIAGNOSIS — F250 Schizoaffective disorder, bipolar type: Secondary | ICD-10-CM | POA: Diagnosis present

## 2020-02-10 LAB — URINALYSIS, COMPLETE (UACMP) WITH MICROSCOPIC
Bacteria, UA: NONE SEEN
Bilirubin Urine: NEGATIVE
Glucose, UA: NEGATIVE mg/dL
Hgb urine dipstick: NEGATIVE
Ketones, ur: NEGATIVE mg/dL
Leukocytes,Ua: NEGATIVE
Nitrite: NEGATIVE
Protein, ur: NEGATIVE mg/dL
Specific Gravity, Urine: 1.03 (ref 1.005–1.030)
pH: 5 (ref 5.0–8.0)

## 2020-02-10 LAB — RAPID URINE DRUG SCREEN, HOSP PERFORMED
Amphetamines: NOT DETECTED
Barbiturates: NOT DETECTED
Benzodiazepines: NOT DETECTED
Cocaine: NOT DETECTED
Opiates: NOT DETECTED
Tetrahydrocannabinol: NOT DETECTED

## 2020-02-10 LAB — RESPIRATORY PANEL BY RT PCR (FLU A&B, COVID)
Influenza A by PCR: NEGATIVE
Influenza B by PCR: NEGATIVE
SARS Coronavirus 2 by RT PCR: NEGATIVE

## 2020-02-10 MED ORDER — LORAZEPAM 1 MG PO TABS
1.0000 mg | ORAL_TABLET | Freq: Once | ORAL | Status: AC
  Administered 2020-02-10: 1 mg via ORAL
  Filled 2020-02-10: qty 1

## 2020-02-10 MED ORDER — RISPERIDONE 1 MG PO TABS
1.0000 mg | ORAL_TABLET | ORAL | Status: DC
  Administered 2020-02-11: 08:00:00 1 mg via ORAL
  Filled 2020-02-10 (×2): qty 1

## 2020-02-10 MED ORDER — ACETAMINOPHEN 325 MG PO TABS
650.0000 mg | ORAL_TABLET | Freq: Four times a day (QID) | ORAL | Status: DC | PRN
  Administered 2020-02-10 – 2020-02-11 (×2): 650 mg via ORAL
  Filled 2020-02-10 (×2): qty 2

## 2020-02-10 MED ORDER — DIVALPROEX SODIUM ER 500 MG PO TB24
500.0000 mg | ORAL_TABLET | Freq: Every day | ORAL | Status: DC
  Administered 2020-02-10 – 2020-02-12 (×3): 500 mg via ORAL
  Filled 2020-02-10 (×6): qty 1

## 2020-02-10 MED ORDER — TRAZODONE HCL 50 MG PO TABS
50.0000 mg | ORAL_TABLET | Freq: Every evening | ORAL | Status: DC | PRN
  Administered 2020-02-10 – 2020-02-12 (×3): 50 mg via ORAL
  Filled 2020-02-10 (×4): qty 1

## 2020-02-10 MED ORDER — RISPERIDONE 2 MG PO TABS
2.0000 mg | ORAL_TABLET | Freq: Every day | ORAL | Status: DC
  Administered 2020-02-10: 2 mg via ORAL
  Filled 2020-02-10 (×3): qty 1

## 2020-02-10 MED ORDER — HYDROCODONE-ACETAMINOPHEN 5-325 MG PO TABS
1.0000 | ORAL_TABLET | Freq: Once | ORAL | Status: AC
  Administered 2020-02-10: 09:00:00 1 via ORAL
  Filled 2020-02-10: qty 1

## 2020-02-10 MED ORDER — LORAZEPAM 0.5 MG PO TABS
0.5000 mg | ORAL_TABLET | Freq: Four times a day (QID) | ORAL | Status: DC | PRN
  Administered 2020-02-10 – 2020-02-13 (×5): 0.5 mg via ORAL
  Filled 2020-02-10 (×5): qty 1

## 2020-02-10 MED ORDER — TRAZODONE HCL 50 MG PO TABS: 50.0000 mg | ORAL_TABLET | Freq: Every evening | ORAL | Status: DC | PRN

## 2020-02-10 NOTE — ED Notes (Signed)
Electronic monitor not working at this time

## 2020-02-10 NOTE — Progress Notes (Signed)
Patient needs a new COVID-19 test - she had a negative result on 01/12/20 - but will need a new one for this visit.  With a current negative COVID 19 result - pt can be admitted to [id number] to the service of [facility]

## 2020-02-10 NOTE — Progress Notes (Signed)
   02/10/20 2025  Psych Admission Type (Psych Patients Only)  Admission Status Voluntary  Psychosocial Assessment  Patient Complaints Other (Comment) (pt requesting vicodin)  Eye Contact Fair  Facial Expression Flat;Worried  Affect Appropriate to circumstance  Speech Tangential;Pressured  Interaction Demanding;Defensive  Motor Activity Fidgety;Restless  Appearance/Hygiene Disheveled  Behavior Characteristics Cooperative  Mood Depressed  Aggressive Behavior  Effect No apparent injury  Thought Process  Coherency Disorganized;Flight of ideas  Content Blaming others;Preoccupation  Delusions None reported or observed  Perception Hallucinations  Hallucination Auditory (Actively having conversations with herself)  Judgment Impaired  Confusion Mild  Danger to Self  Current suicidal ideation? Denies  Danger to Others  Danger to Others None reported or observed

## 2020-02-10 NOTE — BH Assessment (Signed)
Tele Assessment Note   Patient Name: Brittany Valdez MRN: [id number] Referring Physician: Gailen Shelter, PA Location of Patient: [facility] Location of Provider: Behavioral Health TTS Department  Brittany Valdez is an 39 y.o. female who presents to the ED voluntarily. Pt states she has been experiencing increased anxiety and worry. Pt states she has difficulty sleeping due to anxiety. Pt states "everyday life" causes panic attacks. Pt is disorganized during the assessment and states she is dealing with a court case worth 84 million dollars. Pt yells "they've already got 84 million dollars from me!" unprovoked. Pt rambles about her lawyers who have handled the case and she states "that was some other [place]." Pt reports she has an ACTT team but states she does not know the name or contact information but reports she has a nurse come to her home weekly to give her medication.  TTS requested consent to contact collateral supports and pt declined stating she has no other resources.  Per chart review, pt was previously assessed on 12/07/19 ay [facility] ED and at that time pt presented as delusional and disorganized and was admitted to [facility] due to psychosis.   Nira Conn, NP recommends continued observation for safety and stabilization and to be reassessed in the AM by psych. EDP Fayrene Helper, PA-C and Chrisco, Wynona Canes, RN have been advised.  Diagnosis: Schizoaffective disorder, bipolar type   Past Medical History:  Past Medical History:  Diagnosis Date  . Chronic neck pain     Past Surgical History:  Procedure Laterality Date  . CERVICAL SPINE SURGERY    . TUBAL LIGATION      Family History: No family history on file.  Social History:  reports that she has been smoking cigarettes. She has been smoking about 0.50 packs per day. She has never used smokeless tobacco. She reports that she does not drink alcohol or use drugs.  Additional Social History:  Alcohol / Drug Use Pain Medications: See  MAR Prescriptions: See MAR Over the Counter: See MAR History of alcohol / drug use?: No history of alcohol / drug abuse  CIWA: CIWA-Ar BP: (!) 130/118 Pulse Rate: 76 COWS:    Allergies: No Known Allergies  Home Medications: (Not in a hospital admission)   OB/GYN Status:  No LMP recorded.  General Assessment Data Location of Assessment: [facility] ED TTS Assessment: In system Is this a Tele or Face-to-Face Assessment?: Tele Assessment Is this an Initial Assessment or a Re-assessment for this encounter?: Initial Assessment Patient Accompanied by:: N/A Language Other than English: No Living Arrangements: In Group Home: (Comment: Name of Group Home) What gender do you identify as?: Female Marital status: Divorced Pregnancy Status: No Living Arrangements: Group Home Can pt return to current living arrangement?: Yes Admission Status: Voluntary Is patient capable of signing voluntary admission?: Yes Referral Source: Self/Family/Friend Insurance type: [facility]     Crisis Care Plan Living Arrangements: Group Home Name of Psychiatrist: ACTT  Name of Therapist: ACTT  Education Status Is patient currently in school?: No Is the patient employed, unemployed or receiving disability?: Receiving disability income  Risk to self with the past 6 months Suicidal Ideation: No Has patient been a risk to self within the past 6 months prior to admission? : No Suicidal Intent: No Has patient had any suicidal intent within the past 6 months prior to admission? : No Is patient at risk for suicide?: No Suicidal Plan?: No Has patient had any suicidal plan within the past 6 months prior to admission? :  No Access to Means: No What has been your use of drugs/alcohol within the last 12 months?: denies Previous Attempts/Gestures: No Triggers for Past Attempts: None known Intentional Self Injurious Behavior: None Family Suicide History: No Recent stressful life event(s): Other (Comment)(not  sleeping) Persecutory voices/beliefs?: No Depression: No Depression Symptoms: Insomnia Substance abuse history and/or treatment for substance abuse?: No Suicide prevention information given to non-admitted patients: Not applicable  Risk to Others within the past 6 months Homicidal Ideation: No Does patient have any lifetime risk of violence toward others beyond the six months prior to admission? : No Thoughts of Harm to Others: No Current Homicidal Intent: No Current Homicidal Plan: No Access to Homicidal Means: No History of harm to others?: No Assessment of Violence: None Noted Does patient have access to weapons?: No Criminal Charges Pending?: No Does patient have a court date: No Is patient on probation?: Unknown  Psychosis Hallucinations: Auditory Delusions: Unspecified  Mental Status Report Appearance/Hygiene: Disheveled Eye Contact: Fair Motor Activity: Unsteady Speech: Pressured Level of Consciousness: Alert Mood: Labile, Anxious Affect: Labile, Preoccupied, Anxious Anxiety Level: Panic Attacks Panic attack frequency: daily Most recent panic attack: 02/09/20 Thought Processes: Tangential, Irrelevant Judgement: Impaired Orientation: Person, Time Obsessive Compulsive Thoughts/Behaviors: Moderate  Cognitive Functioning Concentration: Fair Memory: Remote Intact, Recent Intact Is patient IDD: No Insight: Fair Impulse Control: Good Appetite: Good Have you had any weight changes? : No Change Sleep: Decreased Total Hours of Sleep: 2 Vegetative Symptoms: None  ADLScreening [facility] Assessment Services) Patient's cognitive ability adequate to safely complete daily activities?: Yes Patient able to express need for assistance with ADLs?: Yes Independently performs ADLs?: Yes (appropriate for developmental age)  Prior Inpatient Therapy Prior Inpatient Therapy: Yes Prior Therapy Dates: 2020 Prior Therapy Facilty/Provider(s): [facility] Reason for Treatment:  Schizoaffective disorder, bipolar type   Prior Outpatient Therapy Prior Outpatient Therapy: Yes Prior Therapy Dates: ongoimng Prior Therapy Facilty/Provider(s): [facility]  Reason for Treatment: Schizoaffective disorder, bipolar type  Does patient have an ACCT team?: Yes Does patient have Intensive In-House Services?  : No Does patient have [facility] services? : No Does patient have P4CC services?: No  ADL Screening (condition at time of admission) Patient's cognitive ability adequate to safely complete daily activities?: Yes Is the patient deaf or have difficulty hearing?: No Does the patient have difficulty seeing, even when wearing glasses/contacts?: No Does the patient have difficulty concentrating, remembering, or making decisions?: No Patient able to express need for assistance with ADLs?: Yes Does the patient have difficulty dressing or bathing?: No Independently performs ADLs?: Yes (appropriate for developmental age) Does the patient have difficulty walking or climbing stairs?: No Weakness of Legs: None Weakness of Arms/Hands: None  Home Assistive Devices/Equipment Home Assistive Devices/Equipment: None    Abuse/Neglect Assessment (Assessment to be complete while patient is alone) Abuse/Neglect Assessment Can Be Completed: Yes Physical Abuse: Denies Verbal Abuse: Denies Sexual Abuse: Denies Exploitation of patient/patient's resources: Denies Self-Neglect: Denies     Merchant navy officer (For Healthcare) Does Patient Have a Medical Advance Directive?: No Would patient like information on creating a medical advance directive?: No - Patient declined          Disposition: Nira Conn, NP recommends continued observation for safety and stabilization and to be reassessed in the AM by psych.  Disposition Initial Assessment Completed for this Encounter: Yes Disposition of Patient: (OVERNIGHT OBS) Patient refused recommended treatment: No  This service was provided via  telemedicine using a 2-way, interactive audio and video technology.  Names of all persons participating  in this telemedicine service and their role in this encounter. Name: Ginia Rudell Role: Patient  Name: Lind Covert Role: TTS          Lyanne Co 02/10/2020 4:25 AM

## 2020-02-10 NOTE — ED Notes (Signed)
Patient reports that she is still in pain. Reports history of back pain

## 2020-02-10 NOTE — Progress Notes (Signed)
   02/10/20 2025  COVID-19 Daily Checkoff  Have you had a fever (temp > 37.80C/100F)  in the past 24 hours?  No  If you have had runny nose, nasal congestion, sneezing in the past 24 hours, has it worsened? No  COVID-19 EXPOSURE  Have you traveled outside the state in the past 14 days? No  Have you been in contact with someone with a confirmed diagnosis of COVID-19 or PUI in the past 14 days without wearing appropriate PPE? No  Have you been living in the same home as a person with confirmed diagnosis of COVID-19 or a PUI (household contact)? No  Have you been diagnosed with COVID-19? No

## 2020-02-10 NOTE — ED Notes (Signed)
The pt has been pacing since she had her tts

## 2020-02-10 NOTE — ED Notes (Signed)
Voluntary admit consent signed PT encouraged to call family

## 2020-02-10 NOTE — ED Notes (Signed)
The pt will need to be assessed by the psychiatrist  In the am
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
## 2020-02-10 NOTE — Progress Notes (Signed)
Nira Conn, NP recommends continued observation for safety and stabilization and to be reassessed in the AM by psych. EDP Fayrene Helper, PA-C and Chrisco, Wynona Canes, RN have been advised.

## 2020-02-10 NOTE — ED Provider Notes (Signed)
Patient accepted in transfer to [facility] Patient ambulatory and calm. Labs reviewed Appears stable for transfer    Margarita Grizzle, MD 02/10/20 [phone]

## 2020-02-10 NOTE — ED Notes (Signed)
Pt getting tts at present

## 2020-02-10 NOTE — ED Notes (Signed)
pts belongings inventoried 2 bags with clothes only one pack  Of cig in jacket pocket  Left there   2 shirts   Jacket     Shoes

## 2020-02-10 NOTE — ED Notes (Signed)
Pt sleeping. 

## 2020-02-10 NOTE — ED Notes (Signed)
Pt still has not had her tts charge rn has called and was told that they knew about her

## 2020-02-10 NOTE — [facility] Suicide Risk Assessment (Signed)
[facility] Admission Suicide Risk Assessment   Nursing information obtained from:  Patient Demographic factors:  Caucasian, Unemployed, Adolescent or young adult Current Mental Status:  NA Loss Factors:  Financial problems / change in socioeconomic status Historical Factors:  NA Risk Reduction Factors:  Living with another person, especially a relative  Total Time spent with patient: 45 minutes Principal Problem:  Schizoaffective Disorder  Diagnosis:  Active Problems:   Schizoaffective disorder, bipolar type (HCC)  Subjective Data:   Continued Clinical Symptoms:  Alcohol Use Disorder Identification Test Final Score (AUDIT): 4 The "Alcohol Use Disorders Identification Test", Guidelines for Use in Primary Care, Second Edition.  World Science writer [facility]). Score between 0-7:  no or low risk or alcohol related problems. Score between 8-15:  moderate risk of alcohol related problems. Score between 16-19:  high risk of alcohol related problems. Score 20 or above:  warrants further diagnostic evaluation for alcohol dependence and treatment.   CLINICAL FACTORS:  45 y old female, presented to ED reporting chronic lower back, extremity pain. Noted to be disorganized/psychotic . 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 She currently presents with disorganized thoughts and speech, tangential, grandiose, making nonsensical/delusional statements. Denies drug abuse and admission UDS was negative. Apparently was not taking any psychiatric medications at admission ( as per chart most recently prescribed  medications were Gean Birchwood on 2/2 and Depakote ER 750 mgrs QHS)     Psychiatric Specialty Exam: Physical Exam  Review of Systems  Blood pressure 117/72, pulse 74, temperature 97.7 F (36.5 C), temperature source Oral, resp. rate 18, height 5\' 7"  (1.702 m), weight 93.9 kg, last menstrual period 01/10/2020, SpO2  100 %.Body mass index is 32.42 kg/m.     COGNITIVE FEATURES THAT CONTRIBUTE TO RISK:  Closed-mindedness and Loss of executive function    SUICIDE RISK:   Moderate:  Frequent suicidal ideation with limited intensity, and duration, some specificity in terms of plans, no associated intent, good self-control, limited dysphoria/symptomatology, some risk factors present, and identifiable protective factors, including available and accessible social support.  PLAN OF CARE: Patient will be admitted to inpatient psychiatric unit for stabilization and safety. Will provide and encourage milieu participation. Provide medication management and maked adjustments as needed.  Will follow daily.    I certify that inpatient services furnished can reasonably be expected to improve the patient's condition.   01/12/2020, MD 02/10/2020, 5:13 PM

## 2020-02-10 NOTE — H&P (Signed)
Psychiatric Admission Assessment Adult  Patient Identification: Brittany Valdez MRN:  [id number] Date of Evaluation:  02/10/2020 Chief Complaint: " my back hurts " Principal Diagnosis: Schizoaffective Disorder Diagnosis:  Schizoaffective Disorder  History of Present Illness: 39 year old female. She has a history of mental illness and has been diagnosed with Schizophrenia /Schizoaffective Disorder in the past .Presented to ED on 2/19, reporting pain, mainly on lower back and lower extremities (she describes this pain as chronic, occurring for at least 6 months). She was noted to present with disorganized speech and thought process .  Today patient presents alert, attentive, cooperative , but poor historian , unable to provide much information and presenting with disorganized thought process and bizarre/grandiose ideations. During session makes  statements such as " I am an illuminati, daughter of Jesus, my soul has been around since [address] BC".  " I have a million children and many houses that interconnect" " There are 84 other [name], but I am the real one". " I don't have seizures, but my son does, because this woman carried him inside her for 29 years ". 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 Patient denies alcohol or drug use, but states " I was taking  a scientific compound that cleans the spirit",but unable to identify or describe further. Her admission UDS negative, admission BAL 14.  She denies depression or suicidal ideations. She denies hallucinations .    Associated Signs/Symptoms: Depression Symptoms:  insomnia, decreased appetite,  Denies other neuro-vegetative symptoms of depression, denies suicidal ideations (Hypo) Manic Symptoms:  Grandiose ideations, poor sleep Anxiety Symptoms:  Reports she has been feeling anxious  Psychotic  Symptoms: denies hallucinations.  PTSD Symptoms: Does not currently endorse Total Time spent with patient: 45 minutes  Past Psychiatric History- patient has been diagnosed with schizophrenia in the past . [facility] after being found by police standing outside in near freezing temperatures making nonsensical /delusional statements. At the time UDS was positive for amphetamines . She was diagnosed with Schizoaffective Disorder  and Amphetamine Induced Psychosis. She was discharged on Risperidone 8 mgrs daily, Trazodone 150 mgrs QHS, Cogentin 1 mgr BID, Tegretol 200 mgrs BID. She had a more recent [facility] [place], at which time she was treated with Hinda GlatterInvega, given a loading [place]ve[place] Sustenna dose of 234 mgr on 2/2 , and with Depakote  As per chart she is followed by  ACT team . Currently patient denies history of depression, denies history of suicidal attempts .   Is the patient at risk to self? Yes.    Has the patient been a risk to self in the past 6 months? No.  Has the patient been a risk to self within the distant past? No.  Is the patient a risk to others? No.  Has the patient been a risk to others in the past 6 months? No.  Has the patient been a risk to others within the distant past? No.   Prior Inpatient Therapy:  history of prior psychiatric admissions  Prior Outpatient Therapy:  was followed by ACT Team ( [facility])   Alcohol Screening:   Substance Abuse History in the last 12 months:denies alcohol or drug abuse . Chart notes indicate history of methamphetamine abuse.  Consequences of Substance Abuse: Denies  Previous Psychotropic Medications:  As per chart notes, patient received Gean Birchwood on 2/2, and was scheduled for a 156mg  dose on 2/8 but unclear if received said dose . Depakote 750 mgrs QHS, Neurontin 200 mgs BID. Patient reports she has not been taking any medications prior to admission. Psychological  Evaluations: No  Past Medical History:  Denies medical illnesses, NKDA.  Past Medical History:  Diagnosis Date  . Chronic neck pain     Past Surgical History:  Procedure Laterality Date  . CERVICAL SPINE SURGERY    . TUBAL LIGATION     Family History: difficult to assess due to present psychotic symptoms, states " I have 12 fathers and 6 mothers".  Family Psychiatric  History: denies history of mental illness in family. Denies suicides in family. Tobacco Screening:  reports she smokes 1/2 PPD  Social History: 38, single , as per chart had been residing at 4/8  Social History   Substance and Sexual Activity  Alcohol Use No     Social History   Substance and Sexual Activity  Drug Use No    Additional Social History:  Allergies:  No Known Allergies Lab Results:  Results for orders placed or performed during the hospital encounter of 02/09/20 (from the past 48 hour(s))  Comprehensive metabolic panel     Status: Abnormal   Collection Time: 02/09/20  8:55 PM  Result Value Ref Range   Sodium 137 135 - 145 mmol/L   Potassium 3.9 3.5 - 5.1 mmol/L   Chloride 105 98 - 111 mmol/L   CO2 23 22 - 32 mmol/L   Glucose, Bld 88 70 - 99 mg/dL   BUN 6 6 - 20 mg/dL   Creatinine, Ser 02/11/20 0.44 - 1.00 mg/dL   Calcium 8.7 (L) 8.9 - 10.3 mg/dL   Total Protein 6.3 (L) 6.5 - 8.1 g/dL   Albumin 3.5 3.5 - 5.0 g/dL   AST 12 (L) 15 - 41 U/L   ALT 12 0 - 44 U/L   Alkaline Phosphatase 44 38 - 126 U/L   Total Bilirubin 0.3 0.3 - 1.2 mg/dL   GFR calc non Af Amer >60 >60 mL/min   GFR calc Af Amer >60 >60 mL/min   Anion gap 9 5 - 15    Comment: Performed at [facility], [address], [place], [place]  CBC with Diff     Status: None   Collection Time: 02/09/20  8:55 PM  Result Value Ref Range   WBC 7.0 4.0 - 10.5 K/uL   RBC 4.38 3.87 - 5.11 MIL/uL   Hemoglobin 13.1 12.0 - 15.0 g/dL   HCT 02/11/20 54.0 - 98.1 %   MCV 92.7 80.0 - 100.0 fL   MCH 29.9 26.0 - 34.0 pg   MCHC  32.3 30.0 - 36.0 g/dL   RDW 19.1 47.8 - 29.5 %   Platelets 281 150 - 400 K/uL   nRBC 0.0 0.0 - 0.2 %   Neutrophils Relative % 52 %   Neutro Abs 3.6 1.7 - 7.7 K/uL   Lymphocytes Relative 42 %   Lymphs Abs 2.9 0.7 - 4.0 K/uL   Monocytes Relative 5 %   Monocytes Absolute 0.4 0.1 - 1.0 K/uL   Eosinophils Relative 1 %   Eosinophils Absolute 0.1 0.0 - 0.5 K/uL   Basophils Relative 0 %   Basophils Absolute 0.0 0.0 - 0.1 K/uL   Immature Granulocytes 0 %   Abs Immature Granulocytes 0.02 0.00 - 0.07 K/uL  Comment: Performed at [facility], [address] [place], [place] [address]  I-Stat beta hCG blood, ED     Status: None   Collection Time: 02/09/20  9:06 PM  Result Value Ref Range   I-stat hCG, quantitative <5.0 <5 mIU/mL   Comment 3            Comment:   GEST. AGE      CONC.  (mIU/mL)   <=1 WEEK        5 - 50     2 WEEKS       50 - 500     3 WEEKS       100 - 10,000     4 WEEKS     1,000 - 30,000        FEMALE AND NON-PREGNANT FEMALE:     LESS THAN 5 mIU/mL   Ethanol     Status: Abnormal   Collection Time: 02/09/20  9:55 PM  Result Value Ref Range   Alcohol, Ethyl (B) 14 (H) <10 mg/dL    Comment: (NOTE) Lowest detectable limit for serum alcohol is 10 mg/dL. For medical purposes only. Performed at [facility], [address] [place], [place] [address]   Acetaminophen level     Status: Abnormal   Collection Time: 02/09/20  9:55 PM  Result Value Ref Range   Acetaminophen (Tylenol), Serum <10 (L) 10 - 30 ug/mL    Comment: (NOTE) Therapeutic concentrations vary significantly. A range of 10-30 ug/mL  may be an effective concentration for many patients. However, some  are best treated at concentrations outside of this range. Acetaminophen concentrations >150 ug/mL at 4 hours after ingestion  and >50 ug/mL at 12 hours after ingestion are often associated with  toxic reactions. Performed at [facility], [address] [place], [place] [address]    Salicylate level     Status: Abnormal   Collection Time: 02/09/20  9:55 PM  Result Value Ref Range   Salicylate Lvl <7.0 (L) 7.0 - 30.0 mg/dL    Comment: Performed at [facility], [address], [place], [place] [address]  Urine rapid drug screen (hosp performed)     Status: None   Collection Time: 02/10/20  4:22 AM  Result Value Ref Range   Opiates NONE DETECTED NONE DETECTED   Cocaine NONE DETECTED NONE DETECTED   Benzodiazepines NONE DETECTED NONE DETECTED   Amphetamines NONE DETECTED NONE DETECTED   Tetrahydrocannabinol NONE DETECTED NONE DETECTED   Barbiturates NONE DETECTED NONE DETECTED    Comment: (NOTE) DRUG SCREEN FOR MEDICAL PURPOSES ONLY.  IF CONFIRMATION IS NEEDED FOR ANY PURPOSE, NOTIFY LAB WITHIN 5 DAYS. LOWEST DETECTABLE LIMITS FOR URINE DRUG SCREEN Drug Class                     Cutoff (ng/mL) Amphetamine and metabolites    1000 Barbiturate and metabolites    200 Benzodiazepine                 200 Tricyclics and metabolites     300 Opiates and metabolites        300 Cocaine and metabolites        300 THC                            50 Performed at [facility], [address] [place], [place] [address]  Urinalysis, Complete w Microscopic     Status: None   Collection Time: 02/10/20  4:22 AM  Result Value Ref Range   Color, Urine YELLOW YELLOW   APPearance CLEAR CLEAR   Specific Gravity, Urine 1.030 1.005 - 1.030   pH 5.0 5.0 - 8.0   Glucose, UA NEGATIVE NEGATIVE mg/dL   Hgb urine dipstick NEGATIVE NEGATIVE   Bilirubin Urine NEGATIVE NEGATIVE   Ketones, ur NEGATIVE NEGATIVE mg/dL   Protein, ur NEGATIVE NEGATIVE mg/dL   Nitrite NEGATIVE NEGATIVE   Leukocytes,Ua NEGATIVE NEGATIVE   RBC / HPF 0-5 0 - 5 RBC/hpf   WBC, UA 0-5 0 - 5 WBC/hpf   Bacteria, UA NONE SEEN NONE SEEN   Squamous Epithelial / LPF 0-5 0 - 5   Mucus PRESENT     Comment: Performed at [facility] Lab, [address], [place], [place] [address]  Respiratory Panel by  RT PCR (Flu A&B, Covid) - Nasopharyngeal Swab     Status: None   Collection Time: 02/10/20 11:25 AM   Specimen: Nasopharyngeal Swab  Result Value Ref Range   SARS Coronavirus 2 by RT PCR NEGATIVE NEGATIVE    Comment: (NOTE) SARS-CoV-2 target nucleic acids are NOT DETECTED. The SARS-CoV-2 RNA is generally detectable in upper respiratoy specimens during the acute phase of infection. The lowest concentration of SARS-CoV-2 viral copies this assay can detect is 131 copies/mL. A negative result does not preclude SARS-Cov-2 infection and should not be used as the sole basis for treatment or other patient management decisions. A negative result may occur with  improper specimen collection/handling, submission of specimen other than nasopharyngeal swab, presence of viral mutation(s) within the areas targeted by this assay, and inadequate number of viral copies (<131 copies/mL). A negative result must be combined with clinical observations, patient history, and epidemiological information. The expected result is Negative. Fact Sheet for Patients:  https://www.moore.com/ Fact Sheet for Healthcare Providers:  https://www.young.biz/ This test is not yet ap proved or cleared by the [place] FDA and  has been authorized for detection and/or diagnosis of SARS-CoV-2 by FDA under an Emergency Use Authorization (EUA). This EUA will remain  in effect (meaning this test can be used) for the duration of the COVID-19 declaration under Section 564(b)(1) of the Act, 21 U.S.C. section 360bbb-3(b)(1), unless the authorization is terminated or revoked sooner.    Influenza A by PCR NEGATIVE NEGATIVE   Influenza B by PCR NEGATIVE NEGATIVE    Comment: (NOTE) The Xpert Xpress SARS-CoV-2/FLU/RSV assay is intended as an aid in  the diagnosis of influenza from Nasopharyngeal swab specimens and  should not be used as a sole basis for treatment. Nasal washings and   aspirates are unacceptable for Xpert Xpress SARS-CoV-2/FLU/RSV  testing. Fact Sheet for Patients: https://www.moore.com/ Fact Sheet for Healthcare Providers: https://www.young.biz/ This test is not yet approved or cleared by the [place] FDA and  has been authorized for detection and/or diagnosis of SARS-CoV-2 by  FDA under an Emergency Use Authorization (EUA). This EUA will remain  in effect (meaning this test can be used) for the duration of the  Covid-19 declaration under Section 564(b)(1) of the Act, 21  U.S.C. section 360bbb-3(b)(1), unless the authorization is  terminated or revoked. Performed at [facility], [address] [place], [place] [address]     Blood Alcohol level:  Lab Results  Component Value Date   ETH 14 (H) 02/09/2020    Metabolic Disorder Labs:  No results found for:  HGBA1C, MPG No results found for: PROLACTIN No results found for: CHOL, TRIG, HDL, CHOLHDL, VLDL, LDLCALC  Current Medications: No current facility-administered medications for this encounter.   PTA Medications: Medications Prior to Admission  Medication Sig Dispense Refill Last Dose  . benztropine (COGENTIN) 1 MG tablet Take 1 tablet (1 mg total) by mouth 2 (two) times daily. (Patient not taking: Reported on 02/09/2020) 60 tablet 3   . carbamazepine (TEGRETOL) 100 MG chewable tablet Chew 2 tablets (200 mg total) by mouth 2 (two) times daily. (Patient not taking: Reported on 02/09/2020) 120 tablet 2   . risperiDONE (RISPERDAL) 4 MG tablet Take 2 tablets (8 mg total) by mouth at bedtime. (Patient not taking: Reported on 02/09/2020) 60 tablet 2   . traZODone (DESYREL) 150 MG tablet Take 1 tablet (150 mg total) by mouth at bedtime as needed for sleep. (Patient not taking: Reported on 02/09/2020) 90 tablet 1     Musculoskeletal: Strength & Muscle Tone: within normal limits Gait & Station: normal Patient leans: N/A  Psychiatric Specialty  Exam: Physical Exam  Review of Systems  Constitutional: Negative.   HENT: Negative.   Eyes: Negative.   Respiratory: Negative for cough and shortness of breath.   Cardiovascular: Negative for chest pain.  Gastrointestinal: Negative for nausea and vomiting.  Endocrine: Negative.   Genitourinary: Negative.   Musculoskeletal: Positive for back pain.  Allergic/Immunologic: Negative.   Neurological: Negative for seizures.  Hematological: Negative.   Psychiatric/Behavioral: Positive for behavioral problems.    Last menstrual period 01/10/2020.There is no height or weight on file to calculate BMI.  General Appearance: Fairly Groomed  Eye Contact:  Fair  Speech:  Normal Rate  Volume:  variable   Mood:  reports she feels anxious. Denies feeling depressed   Affect:  blunted, vaguely irritable  Thought Process:  Disorganized and Descriptions of Associations: Tangential  Orientation:  Other:  fully alert and attentive . She is oriented x 3   Thought Content:  Delusions,   Suicidal Thoughts:  No denies suicidal or self injurious ideations at this time  Homicidal Thoughts:  No  Memory:  recent and remote fair   Judgement:  Impaired  Insight:  Lacking  Psychomotor Activity:  no overt psychomotor agitation or restlessness   Concentration:  Concentration: Fair and Attention Span: Fair  Recall:  [employer] of Knowledge:  Fair  Language:  Fair  Akathisia:  Negative  Handed:  Right  AIMS (if indicated):     Assets:  Desire for Improvement Social Support  ADL's:  Intact  Cognition:  WNL  Sleep:       Treatment Plan Summary: Daily contact with patient to assess and evaluate symptoms and progress in treatment, Medication management, Plan inpatient treatment and medications as below  Observation Level/Precautions:  15 minute checks  Laboratory:  Labs reviewed - BMP unremarkable, CBC unremarkable, pregnancy test negative, no recent EKG  Check EKG , TSH, Lipid Panel, HgbA1C    Psychotherapy: milieu, group therapy   Medications:  As noted , as per chart notes, patient received a loading dose of [place] on 2/2. She also apparently responded to PO Risperidone during her past psychiatric admission to [facility]  in December . Will start Risperidone 1 mgr AM and 2 mgrs QHS initially Will resume Depakote ER at 500 mgrs QHS    Consultations:  As needed   Discharge Concerns:-    Estimated LOS: 5-6 days   Other:     Physician Treatment Plan for  Primary Diagnosis: Schizoaffective Disorder, Bipolar Type Long Term Goal(s): Improvement in symptoms so as ready for discharge  Short Term Goals: Ability to identify changes in lifestyle to reduce recurrence of condition will improve, Ability to verbalize feelings will improve, Ability to disclose and discuss suicidal ideas, Ability to demonstrate self-control will improve, Ability to identify and develop effective coping behaviors will improve, Ability to maintain clinical measurements within normal limits will improve and Compliance with prescribed medications will improve  Physician Treatment Plan for Secondary Diagnosis: Methamphetamine Use Disorder by history Long Term Goal(s): Improvement in symptoms so as ready for discharge  Short Term Goals: Ability to identify changes in lifestyle to reduce recurrence of condition will improve and Ability to identify triggers associated with substance abuse/mental health issues will improve  I certify that inpatient services furnished can reasonably be expected to improve the patient's condition.    Craige Cotta, MD 2/20/20214:14 PM

## 2020-02-10 NOTE — ED Notes (Signed)
Report called to accepting RN Bed 504-2 Safe transport called to pick up patient

## 2020-02-10 NOTE — ED Notes (Signed)
Asked pt for urine sample several times, pt has yet to use restroom. Pt sleeping.

## 2020-02-10 NOTE — Progress Notes (Signed)
Patient ID: Brittany Valdez, female   DOB: 06/09/1981, 39 y.o.   MRN: [id number]   Psychiatry  reassessment   Per HPI: Brittany Valdez is an 39 y.o. female who presents to the ED voluntarily. Pt states she has been experiencing increased anxiety and worry. Pt states she has difficulty sleeping due to anxiety. Pt states "everyday life" causes panic attacks. Pt is disorganized during the assessment and states she is dealing with a court case worth 84 million dollars. Pt yells "they've already got 84 million dollars from me!" unprovoked. Pt rambles about her lawyers who have handled the case and she states "that was some other Iris." Pt reports she has an ACTT team but states she does not know the name or contact information but reports she has a nurse come to her home weekly to give her medication  Psychiatry Evaluation: This is a 39 year old female who presented to [facility] for concerns as noted above. During this evaluation, patient is very disorganized, tangential. presents with loose associations, pressured speech, paranoia, and delusional. She initially  stated." I am here because my legs and back was hurting, I havent slept and I need Ambien." When asked questions her responses were disconnected and she stated things such as,"I have no mental problem. They are working on my brain. I am a doctor and scientist. There is someone out to get me and my kids. What they are saying about me they are trying to rub if off on me.I love children but when I open up, I feel special. I am not a human, I am something else in nature. I am God I am Jesus. I am a work of nature. My soul is old 50 BC that means before christ. Somebody is trying to take over my life. The government gave me a lie detector test." Per chart review, patient has a a psychiatric diagnosis of schizoaffective disorder bipolar type. Per further review, patient was admitted to [facility] on 01/19/2020 where she was petitioned and her  petition reported that she was psychotic and talking to self. She has had frequent episodes of psychosis, delusions, elevated mood and very disorganized in her thoughts with flight of ideas very similar to current presentation. Patient states that she has not been taking her medications because," I am a scientist, I am not mental and I don't need the medication because my brain has already developed." Per chart review, her most recent medications were Gean Birchwood which was last administered on 01/23/2020 and she was to require a subsequent 156mg  dosing on 01/29/2020 and follow up with outpatient services at [facility] however, it is unclear if she followed up. Patient was also on  Depakote 750mg  daily at bedtime, gabapentin 200 mg po bid.  Patient was also referred to ACT with [facility] at that time. Patient denies substance abuse or use and her UDS is negative,ethanol 14. Per chart review, her last place of residence was [facility]'s house. Patient was also discharged from [facility] 12/14/2019  Disposition: Based on my evaluation, patient is clearly psychotic with evidence as noted above. She will require a psychiatric admission which is recommended at this time. I am recommending that patients home medications be reviewed as the ones indicated in the chart as PTA meds are not the same as her discharge medications for 01/19/2020 (noted in chart). I have ordered Trazodone 50 mg po PRN for sleep.

## 2020-02-10 NOTE — ED Notes (Signed)
Breakfast ordered 

## 2020-02-11 MED ORDER — IBUPROFEN 400 MG PO TABS
400.0000 mg | ORAL_TABLET | Freq: Four times a day (QID) | ORAL | Status: DC | PRN
  Administered 2020-02-11 – 2020-02-12 (×3): 400 mg via ORAL
  Filled 2020-02-11 (×3): qty 1

## 2020-02-11 MED ORDER — NICOTINE 21 MG/24HR TD PT24
21.0000 mg | MEDICATED_PATCH | Freq: Every day | TRANSDERMAL | Status: DC
  Administered 2020-02-11 – 2020-02-13 (×3): 21 mg via TRANSDERMAL
  Filled 2020-02-11 (×4): qty 1

## 2020-02-11 MED ORDER — RISPERIDONE 3 MG PO TABS
3.0000 mg | ORAL_TABLET | Freq: Two times a day (BID) | ORAL | Status: DC
  Administered 2020-02-11 – 2020-02-13 (×4): 3 mg via ORAL
  Filled 2020-02-11 (×11): qty 1

## 2020-02-11 MED ORDER — RISPERIDONE 2 MG PO TABS
2.0000 mg | ORAL_TABLET | Freq: Two times a day (BID) | ORAL | Status: DC
  Filled 2020-02-11 (×2): qty 1

## 2020-02-11 NOTE — [facility] Suicide Risk Assessment (Signed)
[facility] INPATIENT:  Family/Significant Other Suicide Prevention Education  Suicide Prevention Education:  Patient Refusal for Family/Significant Other Suicide Prevention Education: The patient Brittany Valdez has refused to provide written consent for family/significant other to be provided Family/Significant Other Suicide Prevention Education during admission and/or prior to discharge.  Physician notified.  Evorn Gong 02/11/2020, 5:23 PM

## 2020-02-11 NOTE — Progress Notes (Signed)
Pt c/o pain to her right knee that started 6 months ago. No swelling or bruising noted. The pt was able to perform knee ROM.   Pt is requesting Suboxone for chronic pain.

## 2020-02-11 NOTE — Progress Notes (Signed)
   02/11/20 1920  COVID-19 Daily Checkoff  Have you had a fever (temp > 37.80C/100F)  in the past 24 hours?  No  If you have had runny nose, nasal congestion, sneezing in the past 24 hours, has it worsened? No  COVID-19 EXPOSURE  Have you traveled outside the state in the past 14 days? No  Have you been in contact with someone with a confirmed diagnosis of COVID-19 or PUI in the past 14 days without wearing appropriate PPE? No  Have you been living in the same home as a person with confirmed diagnosis of COVID-19 or a PUI (household contact)? No  Have you been diagnosed with COVID-19? No

## 2020-02-11 NOTE — Progress Notes (Signed)
Adult Psychoeducational Group Note  Date:  02/11/2020 Time:  11:43 PM  Group Topic/Focus:  Wrap-Up Group:   The focus of this group is to help patients review their daily goal of treatment and discuss progress on daily workbooks.  Participation Level:  Active  Participation Quality:  Appropriate  Affect:  Appropriate  Cognitive:  Appropriate  Insight: Appropriate  Engagement in Group:  Developing/Improving  Modes of Intervention:  Discussion  Additional Comments:  Pt stated her goal for today was to focus on her treatment plan. Pt stated she accomplished her goal today. Pt stated her relationship with her family has improved since she was admitted. Pt stated been able to contact her mother today improved her day. Pt rated her overall day a 8 out of 10. Pt stated her appetite had improved today. Pt stated her sleep last night was pretty good. Pt stated she was in physical pain today. Pt stated her back and right knee was in pain. Pt nurse was made aware of the situation.  Pt deny auditory or visual hallucinations. Pt denies thoughts of harming herself or others. Pt stated she would alert staff if anything changes.   Felipa Furnace 02/11/2020, 11:43 PM

## 2020-02-11 NOTE — [facility] Group Notes (Signed)
[facility] LCSW Group Therapy Note  Date/Time:  02/11/2020 11:15-12:00PM  Type of Therapy and Topic:  Group Therapy:  Healthy and Unhealthy Supports  Participation Level:  Active   Description of Group:  Patients in this group were introduced to the idea of adding a variety of healthy supports to address the various needs in their lives.Patients discussed what additional healthy supports could be helpful in their recovery and wellness after discharge in order to prevent future hospitalizations.   An emphasis was placed on using counselor, doctor, therapy groups, 12-step groups, and problem-specific support groups to expand supports.  They also worked as a group on developing a specific plan for several patients to deal with unhealthy supports through boundary-setting, psychoeducation with loved ones, and even termination of relationships.   Therapeutic Goals:   1)  discuss importance of adding supports to stay well once out of the hospital  2)  compare healthy versus unhealthy supports and identify some examples of each  3)  generate ideas and descriptions of healthy supports that can be added  4)  offer mutual support about how to address unhealthy supports  5)  encourage active participation in and adherence to discharge plan    Summary of Patient Progress:  The patient actively participated in today's group, openly engaging in discussions surrounding supports and identified that a support is someone that has her back through thick and thin. stated that current healthy supports in her life are everyone she has, while not having any current unhealthy supports include.  The patient expressed a desire to add other supports to include the government, republicans, various different services, and the forest services. Pt exhibited tangential speech, irrational thought content, and presented as responding to internal stimuli.    Therapeutic Modalities:   Motivational Interviewing Brief  Solution-Focused Therapy  Leisa Lenz, LCSWA 02/11/20 1:52PM

## 2020-02-11 NOTE — Progress Notes (Signed)
   02/11/20 1920  Psych Admission Type (Psych Patients Only)  Admission Status Voluntary  Psychosocial Assessment  Patient Complaints Anxiety  Eye Contact Fair  Facial Expression Flat  Affect Appropriate to circumstance  Speech Pressured  Interaction Attention-seeking  Motor Activity Slow  Appearance/Hygiene Disheveled  Behavior Characteristics Appropriate to situation;Anxious  Mood Anxious;Preoccupied;Pleasant  Aggressive Behavior  Effect No apparent injury  Thought Process  Coherency Disorganized  Content Blaming others  Delusions None reported or observed  Perception WDL  Hallucination None reported or observed  Judgment Impaired  Confusion Mild  Danger to Self  Current suicidal ideation? Denies  Danger to Others  Danger to Others None reported or observed

## 2020-02-11 NOTE — Progress Notes (Signed)
Pt is requesting Neurontin 500 mg BID.
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
## 2020-02-11 NOTE — Progress Notes (Signed)
[facility] MD Progress Note  02/11/2020 10:37 AM Brittany Valdez  MRN:  [id number] Subjective:  "I need stronger pain meds."  Brittany Valdez found lying in bed. She is focused on medications, asking for Vicodin and Ativan several times throughout assessment. She presents with calmer mood today and is able to provide some relevant answers. She remains tangential and significantly delusional. She states that she has an $84 million court case against [employer] and asks this Clinical research associate to hand over the paperwork for her court case. She also continues to state that there are "72 [name] and I need to figure out which one is the real me." She reports that her mood is improved because "scientists have started giving me a better mood." She denies SI/HI/AVH. She shows no signs of responding to internal stimuli. She reports good sleep overnight, with 6.75 hours of sleep recorded.  From admission H&P: 39 year old female. She has a history of mental illness and has been diagnosed with Schizophrenia /Schizoaffective Disorder in the past .Presented to ED on 2/19, reporting pain, mainly on lower back and lower extremities (she describes this pain as chronic, occurring for at least 6 months). She was noted to present with disorganized speech and thought process .   Principal Problem: <principal problem not specified> Diagnosis: Active Problems:   Schizoaffective disorder, bipolar type (HCC)  Total Time spent with patient: 15 minutes  Past Psychiatric History: See admission H&P  Past Medical History:  Past Medical History:  Diagnosis Date  . Chronic neck pain     Past Surgical History:  Procedure Laterality Date  . CERVICAL SPINE SURGERY    . TUBAL LIGATION     Family History: History reviewed. No pertinent family history. Family Psychiatric  History: See admission H&P Social History:  Social History   Substance and Sexual Activity  Alcohol Use Yes  . Alcohol/week: 2.0 standard drinks  . Types: 2 Cans of beer per  week     Social History   Substance and Sexual Activity  Drug Use No    Social History   Socioeconomic History  . Marital status: Divorced    Spouse name: Not on file  . Number of children: Not on file  . Years of education: Not on file  . Highest education level: Not on file  Occupational History  . Not on file  Tobacco Use  . Smoking status: Current Every Day Smoker    Packs/day: 0.50    Types: Cigarettes  . Smokeless tobacco: Never Used  . Tobacco comment: during assessment pt states 1 p/day use  Substance and Sexual Activity  . Alcohol use: Yes    Alcohol/week: 2.0 standard drinks    Types: 2 Cans of beer per week  . Drug use: No  . Sexual activity: Yes    Birth control/protection: Surgical  Other Topics Concern  . Not on file  Social History Narrative  . Not on file   Social Determinants of Health   Financial Resource Strain:   . Difficulty of Paying Living Expenses: Not on file  Food Insecurity:   . Worried About Programme researcher, broadcasting/film/video in the Last Year: Not on file  . Ran Out of Food in the Last Year: Not on file  Transportation Needs:   . Lack of Transportation (Medical): Not on file  . Lack of Transportation (Non-Medical): Not on file  Physical Activity:   . Days of Exercise per Week: Not on file  . Minutes of Exercise per Session: Not  on file  Stress:   . Feeling of Stress : Not on file  Social Connections:   . Frequency of Communication with Friends and Family: Not on file  . Frequency of Social Gatherings with Friends and Family: Not on file  . Attends Religious Services: Not on file  . Active Member of Clubs or Organizations: Not on file  . Attends Archivist Meetings: Not on file  . Marital Status: Not on file   Additional Social History:                         Sleep: Good  Appetite:  Good  Current Medications: Current Facility-Administered Medications  Medication Dose Route Frequency Provider Last Rate Last Admin  .  acetaminophen (TYLENOL) tablet 650 mg  650 mg Oral Q6H PRN Mordecai Maes, NP   650 mg at 02/11/20 0758  . divalproex (DEPAKOTE ER) 24 hr tablet 500 mg  500 mg Oral QHS Cobos, Myer Peer, MD   500 mg at 02/10/20 2023  . ibuprofen (ADVIL) tablet 400 mg  400 mg Oral Q6H PRN Connye Burkitt, NP      . LORazepam (ATIVAN) tablet 0.5 mg  0.5 mg Oral Q6H PRN Cobos, Myer Peer, MD   0.5 mg at 02/11/20 0811  . nicotine (NICODERM CQ - dosed in mg/24 hours) patch 21 mg  21 mg Transdermal Daily Cobos, Myer Peer, MD   21 mg at 02/11/20 4696  . risperiDONE (RISPERDAL) tablet 2 mg  2 mg Oral BID Connye Burkitt, NP      . traZODone (DESYREL) tablet 50 mg  50 mg Oral QHS PRN Mordecai Maes, NP   50 mg at 02/10/20 2023    Lab Results:  Results for orders placed or performed during the hospital encounter of 02/09/20 (from the past 48 hour(s))  Comprehensive metabolic panel     Status: Abnormal   Collection Time: 02/09/20  8:55 PM  Result Value Ref Range   Sodium 137 135 - 145 mmol/L   Potassium 3.9 3.5 - 5.1 mmol/L   Chloride 105 98 - 111 mmol/L   CO2 23 22 - 32 mmol/L   Glucose, Bld 88 70 - 99 mg/dL   BUN 6 6 - 20 mg/dL   Creatinine, Ser 0.63 0.44 - 1.00 mg/dL   Calcium 8.7 (L) 8.9 - 10.3 mg/dL   Total Protein 6.3 (L) 6.5 - 8.1 g/dL   Albumin 3.5 3.5 - 5.0 g/dL   AST 12 (L) 15 - 41 U/L   ALT 12 0 - 44 U/L   Alkaline Phosphatase 44 38 - 126 U/L   Total Bilirubin 0.3 0.3 - 1.2 mg/dL   GFR calc non Af Amer >60 >60 mL/min   GFR calc Af Amer >60 >60 mL/min   Anion gap 9 5 - 15    Comment: Performed at [facility], [address], [place], [place] [address]  CBC with Diff     Status: None   Collection Time: 02/09/20  8:55 PM  Result Value Ref Range   WBC 7.0 4.0 - 10.5 K/uL   RBC 4.38 3.87 - 5.11 MIL/uL   Hemoglobin 13.1 12.0 - 15.0 g/dL   HCT 40.6 36.0 - 46.0 %   MCV 92.7 80.0 - 100.0 fL   MCH 29.9 26.0 - 34.0 pg   MCHC 32.3 30.0 - 36.0 g/dL   RDW 14.4 11.5 - 15.5 %   Platelets 281 150  -  400 K/uL   nRBC 0.0 0.0 - 0.2 %   Neutrophils Relative % 52 %   Neutro Abs 3.6 1.7 - 7.7 K/uL   Lymphocytes Relative 42 %   Lymphs Abs 2.9 0.7 - 4.0 K/uL   Monocytes Relative 5 %   Monocytes Absolute 0.4 0.1 - 1.0 K/uL   Eosinophils Relative 1 %   Eosinophils Absolute 0.1 0.0 - 0.5 K/uL   Basophils Relative 0 %   Basophils Absolute 0.0 0.0 - 0.1 K/uL   Immature Granulocytes 0 %   Abs Immature Granulocytes 0.02 0.00 - 0.07 K/uL    Comment: Performed at [facility], [address] [place], [place] [address]  I-Stat beta hCG blood, ED     Status: None   Collection Time: 02/09/20  9:06 PM  Result Value Ref Range   I-stat hCG, quantitative <5.0 <5 mIU/mL   Comment 3            Comment:   GEST. AGE      CONC.  (mIU/mL)   <=1 WEEK        5 - 50     2 WEEKS       50 - 500     3 WEEKS       100 - 10,000     4 WEEKS     1,000 - 30,000        FEMALE AND NON-PREGNANT FEMALE:     LESS THAN 5 mIU/mL   Ethanol     Status: Abnormal   Collection Time: 02/09/20  9:55 PM  Result Value Ref Range   Alcohol, Ethyl (B) 14 (H) <10 mg/dL    Comment: (NOTE) Lowest detectable limit for serum alcohol is 10 mg/dL. For medical purposes only. Performed at [facility], [address] [place], [place] [address]   Acetaminophen level     Status: Abnormal   Collection Time: 02/09/20  9:55 PM  Result Value Ref Range   Acetaminophen (Tylenol), Serum <10 (L) 10 - 30 ug/mL    Comment: (NOTE) Therapeutic concentrations vary significantly. A range of 10-30 ug/mL  may be an effective concentration for many patients. However, some  are best treated at concentrations outside of this range. Acetaminophen concentrations >150 ug/mL at 4 hours after ingestion  and >50 ug/mL at 12 hours after ingestion are often associated with  toxic reactions. Performed at [facility], [address] [place], [place] [address]   Salicylate level     Status: Abnormal   Collection Time: 02/09/20  9:55 PM   Result Value Ref Range   Salicylate Lvl <7.0 (L) 7.0 - 30.0 mg/dL    Comment: Performed at [facility], [address] [place], [place] [address]  Urine rapid drug screen (hosp performed)     Status: None   Collection Time: 02/10/20  4:22 AM  Result Value Ref Range   Opiates NONE DETECTED NONE DETECTED   Cocaine NONE DETECTED NONE DETECTED   Benzodiazepines NONE DETECTED NONE DETECTED   Amphetamines NONE DETECTED NONE DETECTED   Tetrahydrocannabinol NONE DETECTED NONE DETECTED   Barbiturates NONE DETECTED NONE DETECTED    Comment: (NOTE) DRUG SCREEN FOR MEDICAL PURPOSES ONLY.  IF CONFIRMATION IS NEEDED FOR ANY PURPOSE, NOTIFY LAB WITHIN 5 DAYS. LOWEST DETECTABLE LIMITS FOR URINE DRUG SCREEN Drug Class                     Cutoff (ng/mL) Amphetamine  and metabolites    1000 Barbiturate and metabolites    200 Benzodiazepine                 200 Tricyclics and metabolites     300 Opiates and metabolites        300 Cocaine and metabolites        300 THC                            50 Performed at [facility], [address] [place], [place] [address]   Urinalysis, Complete w Microscopic     Status: None   Collection Time: 02/10/20  4:22 AM  Result Value Ref Range   Color, Urine YELLOW YELLOW   APPearance CLEAR CLEAR   Specific Gravity, Urine 1.030 1.005 - 1.030   pH 5.0 5.0 - 8.0   Glucose, UA NEGATIVE NEGATIVE mg/dL   Hgb urine dipstick NEGATIVE NEGATIVE   Bilirubin Urine NEGATIVE NEGATIVE   Ketones, ur NEGATIVE NEGATIVE mg/dL   Protein, ur NEGATIVE NEGATIVE mg/dL   Nitrite NEGATIVE NEGATIVE   Leukocytes,Ua NEGATIVE NEGATIVE   RBC / HPF 0-5 0 - 5 RBC/hpf   WBC, UA 0-5 0 - 5 WBC/hpf   Bacteria, UA NONE SEEN NONE SEEN   Squamous Epithelial / LPF 0-5 0 - 5   Mucus PRESENT     Comment: Performed at [facility], [address] [place], [place] [address]  Respiratory Panel by RT PCR (Flu A&B, Covid) - Nasopharyngeal Swab     Status: None   Collection  Time: 02/10/20 11:25 AM   Specimen: Nasopharyngeal Swab  Result Value Ref Range   SARS Coronavirus 2 by RT PCR NEGATIVE NEGATIVE    Comment: (NOTE) SARS-CoV-2 target nucleic acids are NOT DETECTED. The SARS-CoV-2 RNA is generally detectable in upper respiratoy specimens during the acute phase of infection. The lowest concentration of SARS-CoV-2 viral copies this assay can detect is 131 copies/mL. A negative result does not preclude SARS-Cov-2 infection and should not be used as the sole basis for treatment or other patient management decisions. A negative result may occur with  improper specimen collection/handling, submission of specimen other than nasopharyngeal swab, presence of viral mutation(s) within the areas targeted by this assay, and inadequate number of viral copies (<131 copies/mL). A negative result must be combined with clinical observations, patient history, and epidemiological information. The expected result is Negative. Fact Sheet for Patients:  https://www.moore.com/ Fact Sheet for Healthcare Providers:  https://www.young.biz/ This test is not yet ap proved or cleared by the [place] FDA and  has been authorized for detection and/or diagnosis of SARS-CoV-2 by FDA under an Emergency Use Authorization (EUA). This EUA will remain  in effect (meaning this test can be used) for the duration of the COVID-19 declaration under Section 564(b)(1) of the Act, 21 U.S.C. section 360bbb-3(b)(1), unless the authorization is terminated or revoked sooner.    Influenza A by PCR NEGATIVE NEGATIVE   Influenza B by PCR NEGATIVE NEGATIVE    Comment: (NOTE) The Xpert Xpress SARS-CoV-2/FLU/RSV assay is intended as an aid in  the diagnosis of influenza from Nasopharyngeal swab specimens and  should not be used as a sole basis for treatment. Nasal washings and  aspirates are unacceptable for Xpert Xpress SARS-CoV-2/FLU/RSV  testing. Fact Sheet  for Patients: https://www.moore.com/ Fact Sheet for Healthcare Providers: https://www.young.biz/ This test is not yet approved or cleared by the [place]  FDA and  has been authorized for detection and/or diagnosis of SARS-CoV-2 by  FDA under an Emergency Use Authorization (EUA). This EUA will remain  in effect (meaning this test can be used) for the duration of the  Covid-19 declaration under Section 564(b)(1) of the Act, 21  U.S.C. section 360bbb-3(b)(1), unless the authorization is  terminated or revoked. Performed at [facility], [address] [place], [place] [address]     Blood Alcohol level:  Lab Results  Component Value Date   ETH 14 (H) 02/09/2020    Metabolic Disorder Labs: No results found for: HGBA1C, MPG No results found for: PROLACTIN No results found for: CHOL, TRIG, HDL, CHOLHDL, VLDL, LDLCALC  Physical Findings: AIMS:  , ,  ,  ,    CIWA:    COWS:     Musculoskeletal: Strength & Muscle Tone: within normal limits Gait & Station: normal Patient leans: N/A  Psychiatric Specialty Exam: Physical Exam  Nursing note and vitals reviewed. Constitutional: She is oriented to person, place, and time. She appears well-developed and well-nourished.  Cardiovascular: Normal rate.  Respiratory: Effort normal.  Neurological: She is alert and oriented to person, place, and time.    Review of Systems  Constitutional: Negative.   Respiratory: Negative for cough and shortness of breath.   Psychiatric/Behavioral: Negative for agitation, behavioral problems, dysphoric mood, hallucinations, self-injury, sleep disturbance and suicidal ideas. The patient is not nervous/anxious and is not hyperactive.     Blood pressure 117/72, pulse 74, temperature 97.7 F (36.5 C), temperature source Oral, resp. rate 18, height 5\' 7"  (1.702 m), weight 93.9 kg, last menstrual period 01/10/2020, SpO2 100 %.Body mass index is 32.42 kg/m.   General Appearance: Fairly Groomed  Eye Contact:  Fair  Speech:  Pressured  Volume:  Normal  Mood:  Anxious  Affect:  Congruent  Thought Process:  Descriptions of Associations: Tangential  Orientation:  Full (Time, Place, and Person)  Thought Content:  Delusions  Suicidal Thoughts:  No  Homicidal Thoughts:  No  Memory:  Immediate;   Fair Recent;   Fair  Judgement:  Impaired  Insight:  Lacking  Psychomotor Activity:  Normal  Concentration:  Concentration: Fair and Attention Span: Poor  Recall:  01/12/2020 of Knowledge:  Fair  Language:  Fair  Akathisia:  No  Handed:  Right  AIMS (if indicated):     Assets:  Communication Skills Leisure Time Resilience  ADL's:  Intact  Cognition:  WNL  Sleep:  Number of Hours: 6.75     Treatment Plan Summary: Daily contact with patient to assess and evaluate symptoms and progress in treatment and Medication management   Continue inpatient hospitalization. Check a1c, lipid panel, prolactin, TSH.  Increase Risperdal to 3 mg PO BID for psychosis/mood instability Start ibuprofen 400 mg Q6HR PRN pain Continue Tylenol 650 mg PO Q6HR PRN pain Continue Depakote 500 mg PO QHS for mood Continue Ativan 0.5 mg PO Q6HR PRN anxiety Continue trazodone 50 mg PO QHS PRN insomnia  Patient will participate in the therapeutic group milieu.  Discharge disposition in progress.   [employer], NP 02/11/2020, 10:37 AM

## 2020-02-11 NOTE — Progress Notes (Signed)
D: Patient presents with flat, blunted affect with sad, depressed mood.  Patient has requested "pain medications" twice, indicating that she was taking hydrocodone before admission.  Explained to patient that she does not have hydrocodone ordered; however, she can talk to the doctor regarding her pain issues.Patient has been at the nurse's station; she is tearful at times.  A: Continue to monitor medication management and MD orders.  Safety checks completed every 15 minutes per protocol.  Offer support and encouragement as needed.  R: Patient is receptive to staff; her behavior is appropriate.

## 2020-02-11 NOTE — Progress Notes (Addendum)
   02/11/20 0900  Psych Admission Type (Psych Patients Only)  Admission Status Voluntary  Psychosocial Assessment  Patient Complaints Anxiety  Eye Contact Fair  Facial Expression Flat  Affect Appropriate to circumstance  Speech Pressured  Interaction Attention-seeking  Motor Activity Fidgety  Appearance/Hygiene Disheveled  Behavior Characteristics Cooperative  Mood Preoccupied  Thought Process  Coherency Disorganized  Content Blaming others  Delusions None reported or observed  Perception WDL  Hallucination None reported or observed  Judgment Impaired  Confusion Mild  Danger to Self  Current suicidal ideation? Denies  Danger to Others  Danger to Others None reported or observed    [place] NOVEL CORONAVIRUS (COVID-19) DAILY CHECK-OFF SYMPTOMS - answer yes or no to each - every day NO YES  Have you had a fever in the past 24 hours?  . Fever (Temp > 37.80C / 100F) X   Have you had any of these symptoms in the past 24 hours? . New Cough .  Sore Throat  .  Shortness of Breath .  Difficulty Breathing .  Unexplained Body Aches   X   Have you had any one of these symptoms in the past 24 hours not related to allergies?   . Runny Nose .  Nasal Congestion .  Sneezing   X   If you have had runny nose, nasal congestion, sneezing in the past 24 hours, has it worsened?  X   EXPOSURES - check yes or no X   Have you traveled outside the state in the past 14 days?  X   Have you been in contact with someone with a confirmed diagnosis of COVID-19 or PUI in the past 14 days without wearing appropriate PPE?  X   Have you been living in the same home as a person with confirmed diagnosis of COVID-19 or a PUI (household contact)?    X   Have you been diagnosed with COVID-19?    X              What to do next: Answered NO to all: Answered YES to anything:   Proceed with unit schedule Follow the [facility] Inpatient Flowsheet.

## 2020-02-12 LAB — VALPROIC ACID LEVEL: Valproic Acid Lvl: 20 ug/mL — ABNORMAL LOW (ref 50.0–100.0)

## 2020-02-12 LAB — TSH: TSH: 2.148 u[IU]/mL (ref 0.350–4.500)

## 2020-02-12 LAB — HEMOGLOBIN A1C
Hgb A1c MFr Bld: 5.4 % (ref 4.8–5.6)
Mean Plasma Glucose: 108.28 mg/dL

## 2020-02-12 LAB — LIPID PANEL
Cholesterol: 139 mg/dL (ref 0–200)
HDL: 37 mg/dL — ABNORMAL LOW (ref >40)
LDL Cholesterol: 92 mg/dL (ref 0–99)
Total CHOL/HDL Ratio: 3.8 RATIO
Triglycerides: 49 mg/dL (ref <150)
VLDL: 10 mg/dL (ref 0–40)

## 2020-02-12 MED ORDER — GABAPENTIN 300 MG PO CAPS
300.0000 mg | ORAL_CAPSULE | Freq: Three times a day (TID) | ORAL | Status: DC
  Administered 2020-02-12 – 2020-02-13 (×3): 300 mg via ORAL
  Filled 2020-02-12 (×10): qty 1

## 2020-02-12 NOTE — Progress Notes (Addendum)
[facility] MD Progress Note  02/12/2020 7:52 AM Brittany Valdez  MRN:  [id number] Subjective:  "I'm fine."  Brittany Valdez is a 39 year old female that is currently hospitalized for an acute exacerbation of an underlying schizoaffective disorder.  The patient is calm but still very delusional. She states that she has "millions children and that she works for every branch of the [employer].  "She also claims that her soul was born [birth year] BC and that she goes back-and-forth between present times and the past to "fix all the wrong that has happened." Her speech is tangential and pressured. She says she is ready to go home and that she only came here for low back pain. She denies suicidal ideation. She also deniesvisual hallucinations and auditory hallucinations; however, she claims to "sees everything and hear everything everybody else does" because she is a free soul. She reports good sleep and diet.  From admission H&P: 39 year old female. She has a history of mental illness and has been diagnosed with Schizophrenia /Schizoaffective Disorder in the past .Presented to ED on 2/19, reporting pain, mainly on lower back and lower extremities (she describes this pain as chronic, occurring for at least 6 months). She was noted to present with disorganized speech and thought process .   Principal Problem: <principal problem not specified> Diagnosis: Active Problems:   Schizoaffective disorder, bipolar type ([place])  Total Time spent with patient: 30 minutes  Past Psychiatric History: See admission H&P  Past Medical History:  Past Medical History:  Diagnosis Date  . Chronic neck pain     Past Surgical History:  Procedure Laterality Date  . CERVICAL SPINE SURGERY    . TUBAL LIGATION     Family History: History reviewed. No pertinent family history. Family Psychiatric  History: See admission H&P Social History:  Social History   Substance and Sexual Activity  Alcohol Use Yes  . Alcohol/week: 2.0 standard drinks  .  Types: 2 Cans of beer per week     Social History   Substance and Sexual Activity  Drug Use No    Social History   Socioeconomic History  . Marital status: Divorced    Spouse name: Not on file  . Number of children: Not on file  . Years of education: Not on file  . Highest education level: Not on file  Occupational History  . Not on file  Tobacco Use  . Smoking status: Current Every Day Smoker    Packs/day: 0.50    Types: Cigarettes  . Smokeless tobacco: Never Used  . Tobacco comment: during assessment pt states 1 p/day use  Substance and Sexual Activity  . Alcohol use: Yes    Alcohol/week: 2.0 standard drinks    Types: 2 Cans of beer per week  . Drug use: No  . Sexual activity: Yes    Birth control/protection: Surgical  Other Topics Concern  . Not on file  Social History Narrative  . Not on file   Social Determinants of Health   Financial Resource Strain:   . Difficulty of Paying Living Expenses: Not on file  Food Insecurity:   . Worried About Charity fundraiser in the Last Year: Not on file  . Ran Out of Food in the Last Year: Not on file  Transportation Needs:   . Lack of Transportation (Medical): Not on file  . Lack of Transportation (Non-Medical): Not on file  Physical Activity:   . Days of Exercise per Week: Not on file  . Minutes of  Exercise per Session: Not on file  Stress:   . Feeling of Stress : Not on file  Social Connections:   . Frequency of Communication with Friends and Family: Not on file  . Frequency of Social Gatherings with Friends and Family: Not on file  . Attends Religious Services: Not on file  . Active Member of Clubs or Organizations: Not on file  . Attends Banker Meetings: Not on file  . Marital Status: Not on file   Additional Social History:      Sleep: Good  Appetite:  Good  Current Medications: Current Facility-Administered Medications  Medication Dose Route Frequency Provider Last Rate Last Admin  .  acetaminophen (TYLENOL) tablet 650 mg  650 mg Oral Q6H PRN Denzil Magnuson, NP   650 mg at 02/11/20 0758  . divalproex (DEPAKOTE ER) 24 hr tablet 500 mg  500 mg Oral QHS Cobos, Rockey Situ, MD   500 mg at 02/11/20 2016  . ibuprofen (ADVIL) tablet 400 mg  400 mg Oral Q6H PRN Aldean Baker, NP   400 mg at 02/11/20 2016  . LORazepam (ATIVAN) tablet 0.5 mg  0.5 mg Oral Q6H PRN Cobos, Fernando A, MD   0.5 mg at 02/11/20 1657  . nicotine (NICODERM CQ - dosed in mg/24 hours) patch 21 mg  21 mg Transdermal Daily Cobos, Rockey Situ, MD   21 mg at 02/11/20 4970  . risperiDONE (RISPERDAL) tablet 3 mg  3 mg Oral BID Aldean Baker, NP   3 mg at 02/11/20 2016  . traZODone (DESYREL) tablet 50 mg  50 mg Oral QHS PRN Denzil Magnuson, NP   50 mg at 02/11/20 2016    Lab Results:  Results for orders placed or performed during the hospital encounter of 02/10/20 (from the past 48 hour(s))  Valproic acid level     Status: Abnormal   Collection Time: 02/12/20  6:36 AM  Result Value Ref Range   Valproic Acid Lvl 20 (L) 50.0 - 100.0 ug/mL    Comment: Performed at [facility], [address] [place], [place] [address]    Blood Alcohol level:  Lab Results  Component Value Date   ETH 14 (H) 02/09/2020    Metabolic Disorder Labs: No results found for: HGBA1C, MPG No results found for: PROLACTIN No results found for: CHOL, TRIG, HDL, CHOLHDL, VLDL, LDLCALC  Physical Findings: AIMS:  , ,  ,  ,    CIWA:    COWS:     Musculoskeletal: Strength & Muscle Tone: within normal limits Gait & Station: normal Patient leans: N/A  Psychiatric Specialty Exam: Physical Exam  Nursing note and vitals reviewed. Constitutional: She is oriented to person, place, and time. She appears well-developed and well-nourished.  Cardiovascular: Normal rate.  Respiratory: Effort normal.  Neurological: She is alert and oriented to person, place, and time.    Review of Systems  Constitutional: Negative.    Respiratory: Negative for cough and shortness of breath.   Psychiatric/Behavioral: Negative for agitation, behavioral problems, dysphoric mood, hallucinations, self-injury, sleep disturbance and suicidal ideas. The patient is not nervous/anxious and is not hyperactive.     Blood pressure 107/65, pulse (!) 102, temperature (!) 97.4 F (36.3 C), temperature source Oral, resp. rate 18, height 5\' 7"  (1.702 m), weight 93.9 kg, last menstrual period 01/10/2020, SpO2 100 %.Body mass index is 32.42 kg/m.  General Appearance: Fairly Groomed  Eye Contact:  Fair  Speech:  Pressured  Volume:  Normal  Mood:  Anxious  Affect:  Congruent  Thought Process:  Descriptions of Associations: Tangential  Orientation:  Full (Time, Place, and Person)  Thought Content:  Delusions  Suicidal Thoughts:  No  Homicidal Thoughts:  No  Memory:  Immediate;   Fair Recent;   Fair  Judgement:  Impaired  Insight:  Lacking  Psychomotor Activity:  Normal  Concentration:  Concentration: Fair and Attention Span: Poor  Recall:  [employer] of Knowledge:  Fair  Language:  Fair  Akathisia:  No  Handed:  Right  AIMS (if indicated):     Assets:  Communication Skills Leisure Time Resilience  ADL's:  Intact  Cognition:  WNL  Sleep:  Number of Hours: 6.75     Treatment Plan Summary: Daily contact with patient to assess and evaluate symptoms and progress in treatment and Medication management   Continue inpatient hospitalization.  - normal HbA1c, Normal lipid panel except low HDL, normal TSH - Suboptimal valproic acid level (20 mcg/mL) - Prolactin pending - Continue Risperdal to 3 mg PO BID for psychosis/mood instability - Continue Ibuprofen 400 mg Q6HR PRN for pain - Continue Tylenol 650 mg PO Q6HR PRN for pain - Continue Depakote 500 mg PO QHS for mood disorder - Continue Ativan 0.5 mg PO Q6HR PRN for anxiety - Continue trazodone 50 mg PO QHS PRN for insomnia - Continue therapeutic group milieu.  Discharge  disposition in progress.   Inocencio Homes, Medical Student 02/12/2020, 7:52 AM

## 2020-02-12 NOTE — Progress Notes (Signed)
Recreation Therapy Notes  Date: 2.22.21 Time: 1000 Location: 500 Hall Dayroom  Group Topic: Coping Skills  Goal Area(s) Addresses:  Patients will identify positive coping skills. Patients will identify benefit of using coping skills post d/c.  Behavioral Response: Minimal  Intervention: Worksheet, Music  Activity: Coping Skills A to Z.  Patients were to identify a positive coping skill for each letter of the alphabet.  Education: Pharmacologist, Building control surveyor.   Education Outcome: Acknowledges understanding/In group clarification offered/Needs additional education.   Clinical Observations/Feedback: Pt was tearful and mainly sat and listened to the music.  Pt was able to identify some coping skills as exercise, walking and listen to music.   Caroll Rancher, LRT/CTRS    Caroll Rancher A 02/12/2020 11:47 AM

## 2020-02-12 NOTE — Tx Team (Signed)
Interdisciplinary Treatment and Diagnostic Plan Update  02/12/2020 Time of Session: 10:25am Brittany Valdez MRN: [id number]  Principal Diagnosis: <principal problem not specified>  Secondary Diagnoses: Active Problems:   Schizoaffective disorder, bipolar type ([place])   Current Medications:  Current Facility-Administered Medications  Medication Dose Route Frequency Provider Last Rate Last Admin  . acetaminophen (TYLENOL) tablet 650 mg  650 mg Oral Q6H PRN Mordecai Maes, NP   650 mg at 02/11/20 0758  . divalproex (DEPAKOTE ER) 24 hr tablet 500 mg  500 mg Oral QHS Cobos, Myer Peer, MD   500 mg at 02/11/20 2016  . ibuprofen (ADVIL) tablet 400 mg  400 mg Oral Q6H PRN Connye Burkitt, NP   400 mg at 02/12/20 1045  . LORazepam (ATIVAN) tablet 0.5 mg  0.5 mg Oral Q6H PRN Cobos, Myer Peer, MD   0.5 mg at 02/12/20 1004  . nicotine (NICODERM CQ - dosed in mg/24 hours) patch 21 mg  21 mg Transdermal Daily Cobos, Myer Peer, MD   21 mg at 02/12/20 0810  . risperiDONE (RISPERDAL) tablet 3 mg  3 mg Oral BID Connye Burkitt, NP   3 mg at 02/12/20 0809  . traZODone (DESYREL) tablet 50 mg  50 mg Oral QHS PRN Mordecai Maes, NP   50 mg at 02/11/20 2016   PTA Medications: Medications Prior to Admission  Medication Sig Dispense Refill Last Dose  . benztropine (COGENTIN) 1 MG tablet Take 1 tablet (1 mg total) by mouth 2 (two) times daily. (Patient not taking: Reported on 02/09/2020) 60 tablet 3   . carbamazepine (TEGRETOL) 100 MG chewable tablet Chew 2 tablets (200 mg total) by mouth 2 (two) times daily. (Patient not taking: Reported on 02/09/2020) 120 tablet 2   . risperiDONE (RISPERDAL) 4 MG tablet Take 2 tablets (8 mg total) by mouth at bedtime. (Patient not taking: Reported on 02/09/2020) 60 tablet 2   . traZODone (DESYREL) 150 MG tablet Take 1 tablet (150 mg total) by mouth at bedtime as needed for sleep. (Patient not taking: Reported on 02/09/2020) 90 tablet 1     Patient Stressors:    Patient  Strengths:    Treatment Modalities: Medication Management, Group therapy, Case management,  1 to 1 session with clinician, Psychoeducation, Recreational therapy.   Physician Treatment Plan for Primary Diagnosis: <principal problem not specified> Long Term Goal(s): Improvement in symptoms so as ready for discharge Improvement in symptoms so as ready for discharge   Short Term Goals: Ability to identify changes in lifestyle to reduce recurrence of condition will improve Ability to verbalize feelings will improve Ability to disclose and discuss suicidal ideas Ability to demonstrate self-control will improve Ability to identify and develop effective coping behaviors will improve Ability to maintain clinical measurements within normal limits will improve Compliance with prescribed medications will improve Ability to identify changes in lifestyle to reduce recurrence of condition will improve Ability to identify triggers associated with substance abuse/mental health issues will improve  Medication Management: Evaluate patient's response, side effects, and tolerance of medication regimen.  Therapeutic Interventions: 1 to 1 sessions, Unit Group sessions and Medication administration.  Evaluation of Outcomes: Progressing  Physician Treatment Plan for Secondary Diagnosis: Active Problems:   Schizoaffective disorder, bipolar type ([place])  Long Term Goal(s): Improvement in symptoms so as ready for discharge Improvement in symptoms so as ready for discharge   Short Term Goals: Ability to identify changes in lifestyle to reduce recurrence of condition will improve Ability to verbalize feelings will improve Ability  to disclose and discuss suicidal ideas Ability to demonstrate self-control will improve Ability to identify and develop effective coping behaviors will improve Ability to maintain clinical measurements within normal limits will improve Compliance with prescribed medications will  improve Ability to identify changes in lifestyle to reduce recurrence of condition will improve Ability to identify triggers associated with substance abuse/mental health issues will improve     Medication Management: Evaluate patient's response, side effects, and tolerance of medication regimen.  Therapeutic Interventions: 1 to 1 sessions, Unit Group sessions and Medication administration.  Evaluation of Outcomes: Progressing   RN Treatment Plan for Primary Diagnosis: <principal problem not specified> Long Term Goal(s): Knowledge of disease and therapeutic regimen to maintain health will improve  Short Term Goals: Ability to participate in decision making will improve, Ability to verbalize feelings will improve, Ability to disclose and discuss suicidal ideas, Ability to identify and develop effective coping behaviors will improve and Compliance with prescribed medications will improve  Medication Management: RN will administer medications as ordered by provider, will assess and evaluate patient's response and provide education to patient for prescribed medication. RN will report any adverse and/or side effects to prescribing provider.  Therapeutic Interventions: 1 on 1 counseling sessions, Psychoeducation, Medication administration, Evaluate responses to treatment, Monitor vital signs and CBGs as ordered, Perform/monitor CIWA, COWS, AIMS and Fall Risk screenings as ordered, Perform wound care treatments as ordered.  Evaluation of Outcomes: Progressing   LCSW Treatment Plan for Primary Diagnosis: <principal problem not specified> Long Term Goal(s): Safe transition to appropriate next level of care at discharge, Engage patient in therapeutic group addressing interpersonal concerns.  Short Term Goals: Engage patient in aftercare planning with referrals and resources and Increase skills for wellness and recovery  Therapeutic Interventions: Assess for all discharge needs, 1 to 1 time with  Social worker, Explore available resources and support systems, Assess for adequacy in community support network, Educate family and significant other(s) on suicide prevention, Complete Psychosocial Assessment, Interpersonal group therapy.  Evaluation of Outcomes: Progressing   Progress in Treatment: Attending groups: Yes. Participating in groups: Yes. Taking medication as prescribed: Yes. Toleration medication: Yes. Family/Significant other contact made: No, will contact:  Patient declined SPE; with pt Patient understands diagnosis: No. Discussing patient identified problems/goals with staff: Yes. Medical problems stabilized or resolved: Yes. Denies suicidal/homicidal ideation: Yes. Issues/concerns per patient self-inventory: No. Other:   New problem(s) identified: No, Describe:  None  New Short Term/Long Term Goal(s): Medication stabilization, elimination of SI thoughts, and development of a comprehensive mental wellness plan.   Patient Goals:  "I came here for pain"   Discharge Plan or Barriers: Patient will be discharged home and will follow up with her [facility] team.   Reason for Continuation of Hospitalization: Delusions  Hallucinations Medication stabilization  Estimated Length of Stay: 2-3 days   Attendees: Patient: 02/12/2020  Physician:  02/12/2020   Nursing:  02/12/2020   RN Care Manager: 02/12/2020   Social Worker: Stephannie Peters, LCSW  02/12/2020  Recreational Therapist:  02/12/2020  Other:  02/12/2020   Other:  02/12/2020   Other: 02/12/2020      Scribe for Treatment Team: Delphia Grates, LCSW 02/12/2020 11:08 AM

## 2020-02-12 NOTE — Progress Notes (Signed)
Recreation Therapy Notes  Patient admitted to unit 2.20.21. Due to admission within last year, no new assessment conducted at this time. Last assessment conducted 12.21.20. Patient reports stressor for current admission as not getting enough sleep.  Patient also stated reason for admission was needing medication for back pain and anxiety.  Patient identified areas of improvement as "coping when brain needs to work".   Patient denies SI, HI, AVH at this time. Patient reports goal of not having to come back to the hospital.  Information found below from assessment conducted 12.21.20:   Coping Skills:  Journal, TV, Sports, Music, Exercise, Meditate, Deep Breathing, Talk, Art, Prayer, Read, Hot Bath/Shower, Danc  Leisure Interest:  Reading, Social with friends/family, Problem solving, Aromatherapy, Helping others  Patient Strengths:  Free will; Being strong    Caroll Rancher, LRT/CTRS   Caroll Rancher A 02/12/2020 12:20 PM

## 2020-02-12 NOTE — Progress Notes (Signed)
   02/12/20 2230  Psych Admission Type (Psych Patients Only)  Admission Status Voluntary  Psychosocial Assessment  Patient Complaints Anxiety  Eye Contact Fair  Facial Expression Flat  Affect Flat  Speech Tangential  Interaction Attention-seeking  Motor Activity Slow  Appearance/Hygiene Unremarkable  Behavior Characteristics Cooperative;Anxious  Mood Preoccupied  Aggressive Behavior  Effect No apparent injury  Thought Process  Coherency Disorganized;Tangential  Content Blaming others  Delusions None reported or observed  Perception WDL  Hallucination None reported or observed  Judgment Impaired  Confusion None  Danger to Self  Current suicidal ideation? Denies  Danger to Others  Danger to Others None reported or observed   Pt anxious and wanting her meds. Still delusions of having millions of children to take care of.

## 2020-02-12 NOTE — [facility] Group Notes (Signed)
LCSW Group Therapy Notes  Type of Therapy and Topic: Group Therapy: Healthy Vs. Unhealthy Coping Strategies  Date and Time: 02/12/2020 @ 1:30pm  Participation Level: [facility] PARTICIPATION LEVEL: Did Not Attend  Description of Group:  In this group, patients will be encouraged to explore their healthy and unhealthy coping strategics. Coping strategies are actions that we take to deal with stress, problems, or uncomfortable emotions in our daily lives. Each patient will be challenged to read some scenarios and discuss the unhealthy and healthy coping strategies within those scenarios. Also, each patient will be challenged to describe current healthy and unhealthy strategies that they use in their own lives and discuss the outcomes and barriers to those strategies. This group will be process-oriented, with patients participating in exploration of their own experiences as well as giving and receiving support and challenge from other group members.  Therapeutic Goals: 1. Patient will identify personal healthy and unhealthy coping strategies. 2. Patient will identify healthy and unhealthy coping strategies, in others, through scenarios.  3. Patient will identify expected outcomes of healthy and unhealthy coping strategies. 4. Patient will identify barriers to using healthy coping strategies.   Summary of Patient Progress:  Patient did not attend group due to being asleep. CSW made announcement over the pager and went door to door.     Therapeutic Modalities:  Cognitive Behavioral Therapy Solution Focused Therapy Motivational Interviewing    [place], [place]

## 2020-02-12 NOTE — Progress Notes (Signed)
DAR NOTE: Patient presents with anxious affect and depressed mood.  Denies suicidal thoughts, auditory and visual hallucinations.   Maintained on routine safety checks.  Medications given as prescribed.  Support and encouragement offered as needed.  Attended group and participated.  States goal for today is "pain medication."  Patient visible in milieu interacting with staff and peers.  Requested and received Ativan and Motrin for anxiety and pain with good effect.  Patient is safe on and off the unit.

## 2020-02-13 LAB — PROLACTIN: Prolactin: 112 ng/mL — ABNORMAL HIGH (ref 4.8–23.3)

## 2020-02-13 MED ORDER — BENZTROPINE MESYLATE 1 MG PO TABS: 1.0000 mg | ORAL_TABLET | Freq: Two times a day (BID) | ORAL | 3 refills | Status: DC

## 2020-02-13 MED ORDER — TRAZODONE HCL 150 MG PO TABS: 150.0000 mg | ORAL_TABLET | Freq: Every day | ORAL | 1 refills | Status: DC

## 2020-02-13 MED ORDER — DIVALPROEX SODIUM ER 500 MG PO TB24: 500.0000 mg | ORAL_TABLET | Freq: Every day | ORAL | 2 refills | Status: DC

## 2020-02-13 MED ORDER — RISPERIDONE 4 MG PO TABS: 8.0000 mg | ORAL_TABLET | Freq: Every day | ORAL | 2 refills | Status: DC

## 2020-02-13 MED ORDER — GABAPENTIN 300 MG PO CAPS: 300.0000 mg | ORAL_CAPSULE | Freq: Three times a day (TID) | ORAL | 2 refills | Status: DC

## 2020-02-13 NOTE — Progress Notes (Signed)
Recreation Therapy Notes  Date: 2.23.21 Time: 1000 Location: 500 Hall Dayroom  Group Topic: Communication, Team Building, Problem Solving  Goal Area(s) Addresses:  Patient will effectively work with peer towards shared goal.  Patient will identify skill used to make activity successful.  Patient will identify how skills used during activity can be used to reach post d/c goals.   Behavioral Response: Engaged  Intervention: STEM Activity   Activity: In team's, using 20 small plastic cups, patients were asked to build the tallest free standing tower possible.    Education: Pharmacist, community, Building control surveyor.   Education Outcome: Acknowledges education/In group clarification offered/Needs additional education.   Clinical Observations/Feedback: Pt worked by herself.  Pt was pleasant and active.  Pt was able to stack the cups but not to the tallest point.  Pt was able to focus while she was completing the activity.  Pt left when she finished and did not return.   Caroll Rancher, LRT/CTRS    Caroll Rancher A 02/13/2020 10:43 AM

## 2020-02-13 NOTE — Plan of Care (Signed)
Pt discharged before learning relaxation techniques.    Caroll Rancher, LRT/CTRS

## 2020-02-13 NOTE — Plan of Care (Signed)
Problem: Education: Goal: Knowledge of [facility] Education information/materials will improve 02/13/2020 1109 by Baron Sane, RN Outcome: Adequate for Discharge 02/13/2020 1033 by Baron Sane, RN Outcome: Adequate for Discharge Goal: Emotional status will improve 02/13/2020 1109 by Baron Sane, RN Outcome: Adequate for Discharge 02/13/2020 1033 by Baron Sane, RN Outcome: Adequate for Discharge Goal: Mental status will improve 02/13/2020 1109 by Baron Sane, RN Outcome: Adequate for Discharge 02/13/2020 1033 by Baron Sane, RN Outcome: Adequate for Discharge Goal: Verbalization of understanding the information provided will improve 02/13/2020 1109 by Baron Sane, RN Outcome: Adequate for Discharge 02/13/2020 1033 by Baron Sane, RN Outcome: Adequate for Discharge   Problem: Activity: Goal: Interest or engagement in activities will improve 02/13/2020 1109 by Baron Sane, RN Outcome: Adequate for Discharge 02/13/2020 1033 by Baron Sane, RN Outcome: Adequate for Discharge Goal: Sleeping patterns will improve 02/13/2020 1109 by Baron Sane, RN Outcome: Adequate for Discharge 02/13/2020 1033 by Baron Sane, RN Outcome: Adequate for Discharge   Problem: Coping: Goal: Ability to verbalize frustrations and anger appropriately will improve 02/13/2020 1109 by Baron Sane, RN Outcome: Adequate for Discharge 02/13/2020 1033 by Baron Sane, RN Outcome: Adequate for Discharge Goal: Ability to demonstrate self-control will improve 02/13/2020 1109 by Baron Sane, RN Outcome: Adequate for Discharge 02/13/2020 1033 by Baron Sane, RN Outcome: Adequate for Discharge   Problem: Health Behavior/Discharge Planning: Goal: Identification of resources available to assist in meeting health care needs will improve 02/13/2020 1109 by Baron Sane, RN Outcome: Adequate for  Discharge 02/13/2020 1033 by Baron Sane, RN Outcome: Adequate for Discharge Goal: Compliance with treatment plan for underlying cause of condition will improve 02/13/2020 1109 by Baron Sane, RN Outcome: Adequate for Discharge 02/13/2020 1033 by Baron Sane, RN Outcome: Adequate for Discharge   Problem: Physical Regulation: Goal: Ability to maintain clinical measurements within normal limits will improve 02/13/2020 1109 by Baron Sane, RN Outcome: Adequate for Discharge 02/13/2020 1033 by Baron Sane, RN Outcome: Adequate for Discharge   Problem: Safety: Goal: Periods of time without injury will increase 02/13/2020 1109 by Baron Sane, RN Outcome: Adequate for Discharge 02/13/2020 1033 by Baron Sane, RN Outcome: Adequate for Discharge   Problem: Education: Goal: Ability to state activities that reduce stress will improve 02/13/2020 1109 by Baron Sane, RN Outcome: Adequate for Discharge 02/13/2020 1033 by Baron Sane, RN Outcome: Adequate for Discharge   Problem: Coping: Goal: Ability to identify and develop effective coping behavior will improve 02/13/2020 1109 by Baron Sane, RN Outcome: Adequate for Discharge 02/13/2020 1033 by Baron Sane, RN Outcome: Adequate for Discharge   Problem: Self-Concept: Goal: Ability to identify factors that promote anxiety will improve 02/13/2020 1109 by Baron Sane, RN Outcome: Adequate for Discharge 02/13/2020 1033 by Baron Sane, RN Outcome: Adequate for Discharge Goal: Level of anxiety will decrease 02/13/2020 1109 by Baron Sane, RN Outcome: Adequate for Discharge 02/13/2020 1033 by Baron Sane, RN Outcome: Adequate for Discharge Goal: Ability to modify response to factors that promote anxiety will improve 02/13/2020 1109 by Baron Sane, RN Outcome: Adequate for Discharge 02/13/2020 1033 by Baron Sane, RN Outcome: Adequate  for Discharge   Problem: Education: Goal: Knowledge of [facility] Education information/materials will improve 02/13/2020 1109 by Baron Sane, RN Outcome: Adequate for Discharge 02/13/2020 1033 by Baron Sane, RN Outcome: Adequate  for Discharge Goal: Emotional status will improve 02/13/2020 1109 by Raylene Miyamoto, RN Outcome: Adequate for Discharge 02/13/2020 1033 by Raylene Miyamoto, RN Outcome: Adequate for Discharge Goal: Mental status will improve 02/13/2020 1109 by Raylene Miyamoto, RN Outcome: Adequate for Discharge 02/13/2020 1033 by Raylene Miyamoto, RN Outcome: Adequate for Discharge Goal: Verbalization of understanding the information provided will improve 02/13/2020 1109 by Raylene Miyamoto, RN Outcome: Adequate for Discharge 02/13/2020 1033 by Raylene Miyamoto, RN Outcome: Adequate for Discharge   Problem: Activity: Goal: Interest or engagement in activities will improve 02/13/2020 1109 by Raylene Miyamoto, RN Outcome: Adequate for Discharge 02/13/2020 1033 by Raylene Miyamoto, RN Outcome: Adequate for Discharge Goal: Sleeping patterns will improve 02/13/2020 1109 by Raylene Miyamoto, RN Outcome: Adequate for Discharge 02/13/2020 1033 by Raylene Miyamoto, RN Outcome: Adequate for Discharge   Problem: Coping: Goal: Ability to verbalize frustrations and anger appropriately will improve 02/13/2020 1109 by Raylene Miyamoto, RN Outcome: Adequate for Discharge 02/13/2020 1033 by Raylene Miyamoto, RN Outcome: Adequate for Discharge Goal: Ability to demonstrate self-control will improve 02/13/2020 1109 by Raylene Miyamoto, RN Outcome: Adequate for Discharge 02/13/2020 1033 by Raylene Miyamoto, RN Outcome: Adequate for Discharge   Problem: Health Behavior/Discharge Planning: Goal: Identification of resources available to assist in meeting health care needs will improve 02/13/2020 1109 by Raylene Miyamoto, RN Outcome: Adequate  for Discharge 02/13/2020 1033 by Raylene Miyamoto, RN Outcome: Adequate for Discharge Goal: Compliance with treatment plan for underlying cause of condition will improve 02/13/2020 1109 by Raylene Miyamoto, RN Outcome: Adequate for Discharge 02/13/2020 1033 by Raylene Miyamoto, RN Outcome: Adequate for Discharge   Problem: Physical Regulation: Goal: Ability to maintain clinical measurements within normal limits will improve 02/13/2020 1109 by Raylene Miyamoto, RN Outcome: Adequate for Discharge 02/13/2020 1033 by Raylene Miyamoto, RN Outcome: Adequate for Discharge   Problem: Safety: Goal: Periods of time without injury will increase 02/13/2020 1109 by Raylene Miyamoto, RN Outcome: Adequate for Discharge 02/13/2020 1033 by Raylene Miyamoto, RN Outcome: Adequate for Discharge   Problem: Education: Goal: Ability to state activities that reduce stress will improve 02/13/2020 1109 by Raylene Miyamoto, RN Outcome: Adequate for Discharge 02/13/2020 1033 by Raylene Miyamoto, RN Outcome: Adequate for Discharge   Problem: Coping: Goal: Ability to identify and develop effective coping behavior will improve 02/13/2020 1109 by Raylene Miyamoto, RN Outcome: Adequate for Discharge 02/13/2020 1033 by Raylene Miyamoto, RN Outcome: Adequate for Discharge   Problem: Self-Concept: Goal: Ability to identify factors that promote anxiety will improve 02/13/2020 1109 by Raylene Miyamoto, RN Outcome: Adequate for Discharge 02/13/2020 1033 by Raylene Miyamoto, RN Outcome: Adequate for Discharge Goal: Level of anxiety will decrease 02/13/2020 1109 by Raylene Miyamoto, RN Outcome: Adequate for Discharge 02/13/2020 1033 by Raylene Miyamoto, RN Outcome: Adequate for Discharge Goal: Ability to modify response to factors that promote anxiety will improve 02/13/2020 1109 by Raylene Miyamoto, RN Outcome: Adequate for Discharge 02/13/2020 1033 by Raylene Miyamoto, RN Outcome:  Adequate for Discharge

## 2020-02-13 NOTE — Progress Notes (Signed)
  [facility] Adult Case Management Discharge Plan :  Will you be returning to the same living situation after discharge:  Yes,  home with mother At discharge, do you have transportation home?: Yes,  [facility] Do you have the ability to pay for your medications: Yes,  Medicare  Release of information consent forms completed and in the chart;  Patient's signature needed at discharge.  Patient to Follow up at: Follow-up Information    [facility] Follow up.   Why: Please call the [facility] when you are discharged to follow up with your intake process. Contact information: [address] [place] [address] [phone]           Next level of care provider has access to [facility] Link:no  Safety Planning and Suicide Prevention discussed: No.; Patient declined SPE; with patient      Has patient been referred to the Quitline?: Patient refused referral  Patient has been referred for addiction treatment: Yes  Delphia Grates, LCSW 02/13/2020, 9:37 AM

## 2020-02-13 NOTE — [facility]HH Suicide Risk Assessment (Signed)
[facility] Discharge Suicide Risk Assessment   Principal Problem: Exacerbation of schizoaffective condition Discharge Diagnoses: Active Problems:   Schizoaffective disorder, bipolar type (HCC)   Total Time spent with patient: 45 minutes  Musculoskeletal: Strength & Muscle Tone: within normal limits Gait & Station: normal Patient leans: N/A  Psychiatric Specialty Exam: Review of Systems  Blood pressure 107/65, pulse (!) 102, temperature (!) 97.4 F (36.3 C), temperature source Oral, resp. rate 18, height 5\' 7"  (1.702 m), weight 93.9 kg, last menstrual period 01/10/2020, SpO2 100 %.Body mass index is 32.42 kg/m.  General Appearance: Casual  Eye Contact::  Good  Speech:  Clear and Coherent409  Volume:  Normal  Mood:  Euthymic  Affect:  Congruent  Thought Process:  Goal Directed and Descriptions of Associations: Circumstantial  Orientation:  Full (Time, Place, and Person)  Thought Content:  Baseline delusions and baseline drug-seeking but no acute auditory or visual hallucinations  Suicidal Thoughts:  No  Homicidal Thoughts:  No  Memory:  Immediate;   Fair Recent;   Fair Remote;   Fair  Judgement:  Fair  Insight:  Fair  Psychomotor Activity:  Normal  Concentration:  Fair  Recall:  [id number] of Knowledge:Fair  Language: Fair  Akathisia:  Negative  Handed:  Right  AIMS (if indicated):     Assets:  Communication Skills Physical Health Resilience  Sleep:  Number of Hours: 7.75  Cognition: WNL  ADL's:  Intact   Mental Status Per Nursing Assessment::   On Admission:  NA  Demographic Factors:  Caucasian  Loss Factors: Decrease in vocational status  Historical Factors: NA  Risk Reduction Factors:   Sense of responsibility to family and Religious beliefs about death  Continued Clinical Symptoms:  Previous Psychiatric Diagnoses and Treatments Medical Diagnoses and Treatments/Surgeries  Cognitive Features That Contribute To Risk:  Loss of executive function     Suicide Risk:  Minimal: No identifiable suicidal ideation.  Patients presenting with no risk factors but with morbid ruminations; may be classified as minimal risk based on the severity of the depressive symptoms  Follow-up Information    [facility] Follow up.   Why: Please call the [facility] when you are discharged to follow up with your intake process. Contact information: [address] [place] [phone]           Plan Of Care/Follow-up recommendations:  Activity:  full  Jaselyn Nahm, MD 02/13/2020, 9:42 AM

## 2020-02-13 NOTE — Discharge Summary (Signed)
Physician Discharge Summary Note  Patient:  Brittany Valdez is an 39 y.o., female MRN:  [id number] DOB:  1981-03-27 Patient phone:  [phone] (home)  Patient address:   [address] [place] [address],  Total Time spent with patient: 15 minutes  Date of Admission:  02/10/2020 Date of Discharge: 02/13/20  Reason for Admission:  psychosis  Principal Problem: <principal problem not specified> Discharge Diagnoses: Active Problems:   Schizoaffective disorder, bipolar type [facility])   Past Psychiatric History: patient has been diagnosed with schizophrenia in the past . Was admitted to [facility] on 12/14/2019 after being found by police standing outside in near freezing temperatures making nonsensical /delusional statements. At the time UDS was positive for amphetamines . She was diagnosed with Schizoaffective Disorder  and Amphetamine Induced Psychosis. She was discharged on Risperidone 8 mgrs daily, Trazodone 150 mgrs QHS, Cogentin 1 mgr BID, Tegretol 200 mgrs BID. She had a more recent admission on 01/19/2020 to [facility] in [place], at which time she was treated with Hinda Glatter, given a loading [place] dose of 234 mgr on 2/2 , and with Depakote  As per chart she is followed by  ACT team . Currently patient denies history of depression, denies history of suicidal attempts .   Past Medical History:  Past Medical History:  Diagnosis Date  . Chronic neck pain     Past Surgical History:  Procedure Laterality Date  . CERVICAL SPINE SURGERY    . TUBAL LIGATION     Family History: History reviewed. No pertinent family history. Family Psychiatric  History: denies history of mental illness in family. Denies suicides in family. Social History:  Social History   Substance and Sexual Activity  Alcohol Use Yes  . Alcohol/week: 2.0 standard drinks  . Types: 2 Cans of beer per week     Social History   Substance and Sexual Activity  Drug Use No    Social History   Socioeconomic History  .  Marital status: Divorced    Spouse name: Not on file  . Number of children: Not on file  . Years of education: Not on file  . Highest education level: Not on file  Occupational History  . Not on file  Tobacco Use  . Smoking status: Current Every Day Smoker    Packs/day: 0.50    Types: Cigarettes  . Smokeless tobacco: Never Used  . Tobacco comment: during assessment pt states 1 p/day use  Substance and Sexual Activity  . Alcohol use: Yes    Alcohol/week: 2.0 standard drinks    Types: 2 Cans of beer per week  . Drug use: No  . Sexual activity: Yes    Birth control/protection: Surgical  Other Topics Concern  . Not on file  Social History Narrative  . Not on file   Social Determinants of Health   Financial Resource Strain:   . Difficulty of Paying Living Expenses: Not on file  Food Insecurity:   . Worried About Programme researcher, broadcasting/film/video in the Last Year: Not on file  . Ran Out of Food in the Last Year: Not on file  Transportation Needs:   . Lack of Transportation (Medical): Not on file  . Lack of Transportation (Non-Medical): Not on file  Physical Activity:   . Days of Exercise per Week: Not on file  . Minutes of Exercise per Session: Not on file  Stress:   . Feeling of Stress : Not on file  Social Connections:   . Frequency  of Communication with Friends and Family: Not on file  . Frequency of Social Gatherings with Friends and Family: Not on file  . Attends Religious Services: Not on file  . Active Member of Clubs or Organizations: Not on file  . Attends Banker Meetings: Not on file  . Marital Status: Not on file    Hospital Course:  From admission H&P: 39 year old female. She has a history of mental illness and has been diagnosed with Schizophrenia /Schizoaffective Disorder in the past .Presented to ED on 2/19, reporting pain, mainly on lower back and lower extremities (she describes this pain as chronic, occurring for at least 6 months). She was noted to  present with disorganized speech and thought process .  Today patient presents alert, attentive, cooperative , but poor historian , unable to provide much information and presenting with disorganized thought process and bizarre/grandiose ideations. During session makes  statements such as " I am an illuminati, daughter of Jesus, my soul has been around since 62 BC".  " I have a million children and many houses that interconnect" " There are 84 other [name], but I am the real one". " I don't have seizures, but my son does, because this woman carried him inside her for 29 years ". 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 Patient denies alcohol or drug use, but states " I was taking  a scientific compound that cleans the spirit",but unable to identify or describe further. Her admission UDS negative, admission BAL 14.  She denies depression or suicidal ideations. She denies hallucinations .   [name] initially presented to the ED for reports of back pain and was IVC'd due to psychosis. She presented with disorganized, pressured speech and multiple bizarre delusions about the iluminati, millions of children, and scientists working on her brain on admission. She remained on the [facility] unit for three days. She was started on Risperdal, Cogentin, Depakote, Neurontin, and trazodone. 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 She was noted to be med-seeking for opioids and benzodiazepines during hospitalization but was advised that controlled substances were not indicated. She participated in group therapy on the unit. She responded well to treatment with no adverse effects reported. She has shown improvement with more organized speech and behaviors and improved sleep and interaction. Some delusions appear to be chronic  in nature, but at this time she is less preoccupied with delusions and more reality-based. She is requesting discharge. She has shown no agitated or disruptive behaviors on the unit. She poses no acute risk of harm to self or others. She denies any SI/HI/AVH and contracts for safety. No signs of responding to internal stimuli. She agrees to follow up with the [facility] team (see below). She is provided with prescriptions for medications upon discharge. She is discharging to her mother's home via [employer].   Physical Findings: AIMS:  , ,  ,  ,    CIWA:    COWS:     Musculoskeletal: Strength & Muscle Tone: within normal limits Gait & Station: normal Patient leans: N/A  Psychiatric Specialty Exam: Physical Exam  Nursing note and vitals reviewed. Constitutional: She is oriented to person, place, and time. She appears well-developed and well-nourished.  Cardiovascular: Normal rate.  Respiratory: Effort normal.  Neurological: She  is alert and oriented to person, place, and time.    Review of Systems  Constitutional: Negative.   Respiratory: Negative for cough and shortness of breath.   Psychiatric/Behavioral: Negative for agitation, behavioral problems, dysphoric mood, hallucinations, self-injury, sleep disturbance and suicidal ideas. The patient is not nervous/anxious and is not hyperactive.     Blood pressure 107/65, pulse (!) 102, temperature (!) 97.4 F (36.3 C), temperature source Oral, resp. rate 18, height 5\' 7"  (1.702 m), weight 93.9 kg, last menstrual period 01/10/2020, SpO2 100 %.Body mass index is 32.42 kg/m.  See MD's discharge SRA      Has this patient used any form of tobacco in the last 30 days? (Cigarettes, Smokeless Tobacco, Cigars, and/or Pipes)  Yes, A prescription for an FDA-approved tobacco cessation medication was offered at discharge and the patient refused  Blood Alcohol level:  Lab Results  Component Value Date   ETH 14 (H)  02/09/2020    Metabolic Disorder Labs:  Lab Results  Component Value Date   HGBA1C 5.4 02/12/2020   MPG 108.28 02/12/2020   Lab Results  Component Value Date   PROLACTIN 112.0 (H) 02/12/2020   Lab Results  Component Value Date   CHOL 139 02/12/2020   TRIG 49 02/12/2020   HDL 37 (L) 02/12/2020   CHOLHDL 3.8 02/12/2020   VLDL 10 02/12/2020   LDLCALC 92 02/12/2020    See Psychiatric Specialty Exam and Suicide Risk Assessment completed by Attending Physician prior to discharge.  Discharge destination:  Home  Is patient on multiple antipsychotic therapies at discharge:  No   Has Patient had three or more failed trials of antipsychotic monotherapy by history:  No  Recommended Plan for Multiple Antipsychotic Therapies: NA   Allergies as of 02/13/2020   No Known Allergies     Medication List    STOP taking these medications   carbamazepine 100 MG chewable tablet Commonly known as: TEGRETOL     TAKE these medications     Indication  benztropine 1 MG tablet Commonly known as: COGENTIN Take 1 tablet (1 mg total) by mouth 2 (two) times daily.  Indication: Extrapyramidal Reaction caused by Medications   divalproex 500 MG 24 hr tablet Commonly known as: DEPAKOTE ER Take 1 tablet (500 mg total) by mouth at bedtime.  Indication: Manic Phase of Manic-Depression   gabapentin 300 MG capsule Commonly known as: NEURONTIN Take 1 capsule (300 mg total) by mouth 3 (three) times daily.  Indication: Neuropathic Pain   risperidone 4 MG tablet Commonly known as: RISPERDAL Take 2 tablets (8 mg total) by mouth at bedtime.  Indication: Hypomanic Episode of Bipolar Disorder   traZODone 150 MG tablet Commonly known as: DESYREL Take 1 tablet (150 mg total) by mouth at bedtime. What changed:   when to take this  reasons to take this  Indication: Trouble Sleeping      Follow-up Information    [facility] Follow up.   Why: Please call the [facility]  when you are discharged to follow up with your intake process. Contact information: [address] [place] [phone]           Follow-up recommendations: Activity as tolerated. Diet as recommended by primary care physician. Keep all scheduled follow-up appointments as recommended.   Comments:   Patient is instructed to take all prescribed medications as recommended. Report any side effects or adverse reactions to your outpatient psychiatrist. Patient is instructed to abstain from alcohol and illegal  drugs while on prescription medications. In the event of worsening symptoms, patient is instructed to call the crisis hotline, 911, or go to the nearest emergency department for evaluation and treatment.  Signed: Connye Burkitt, NP 02/13/2020, 10:36 AM

## 2020-02-13 NOTE — Plan of Care (Signed)
Discharge note  Patient verbalizes readiness for discharge. Follow up plan explained, AVS, Transition record and SRA given. Prescriptions and teaching provided. Belongings returned and signed for. Suicide safety plan completed and signed. Patient verbalizes understanding. Patient denies SI/HI and assures this Clinical research associate they will seek assistance should that change. Patient discharged to lobby to wait for Lyft.  Problem: Education: Goal: Knowledge of [facility] Education information/materials will improve Outcome: Adequate for Discharge Goal: Emotional status will improve Outcome: Adequate for Discharge Goal: Mental status will improve Outcome: Adequate for Discharge Goal: Verbalization of understanding the information provided will improve Outcome: Adequate for Discharge   Problem: Activity: Goal: Interest or engagement in activities will improve Outcome: Adequate for Discharge Goal: Sleeping patterns will improve Outcome: Adequate for Discharge   Problem: Coping: Goal: Ability to verbalize frustrations and anger appropriately will improve Outcome: Adequate for Discharge Goal: Ability to demonstrate self-control will improve Outcome: Adequate for Discharge   Problem: Health Behavior/Discharge Planning: Goal: Identification of resources available to assist in meeting health care needs will improve Outcome: Adequate for Discharge Goal: Compliance with treatment plan for underlying cause of condition will improve Outcome: Adequate for Discharge   Problem: Physical Regulation: Goal: Ability to maintain clinical measurements within normal limits will improve Outcome: Adequate for Discharge   Problem: Safety: Goal: Periods of time without injury will increase Outcome: Adequate for Discharge   Problem: Education: Goal: Ability to state activities that reduce stress will improve Outcome: Adequate for Discharge   Problem: Coping: Goal: Ability to identify and develop  effective coping behavior will improve Outcome: Adequate for Discharge   Problem: Self-Concept: Goal: Ability to identify factors that promote anxiety will improve Outcome: Adequate for Discharge Goal: Level of anxiety will decrease Outcome: Adequate for Discharge Goal: Ability to modify response to factors that promote anxiety will improve Outcome: Adequate for Discharge

## 2020-02-13 NOTE — Progress Notes (Signed)
Recreation Therapy Notes  INPATIENT RECREATION TR PLAN  Patient Details Name: Brittany Valdez MRN: [id number] DOB: [birth year] Today's Date: 02/13/2020  Rec Therapy Plan Is patient appropriate for Therapeutic Recreation?: Yes Treatment times per week: about 3 days Estimated Length of Stay: 5-7 days TR Treatment/Interventions: Group participation (Comment)  Discharge Criteria Pt will be discharged from therapy if:: Discharged Treatment plan/goals/alternatives discussed and agreed upon by:: Patient/family  Discharge Summary Short term goals set: See patient care plan Short term goals met: Not met Progress toward goals comments: Groups attended Which groups?: Coping skills, Other (Comment)(Team Building) Reason goals not met: Pt discharged Therapeutic equipment acquired: N/A Reason patient discharged from therapy: Discharge from hospital Pt/family agrees with progress & goals achieved: Yes Date patient discharged from therapy: 02/13/20    Victorino Sparrow, LRT/CTRS  Ria Comment, Asiya Cutbirth A 02/13/2020, 10:36 AM

## 2021-02-05 IMAGING — CR DG LUMBAR SPINE COMPLETE 4+V
5 series · 5 of 5 positions shown · non-contrast
Comparison: None.

CLINICAL DATA: Lumbosacral back pain. No known injury.

EXAM:
LUMBAR SPINE - COMPLETE 4+ VIEW

[l-spine ap]
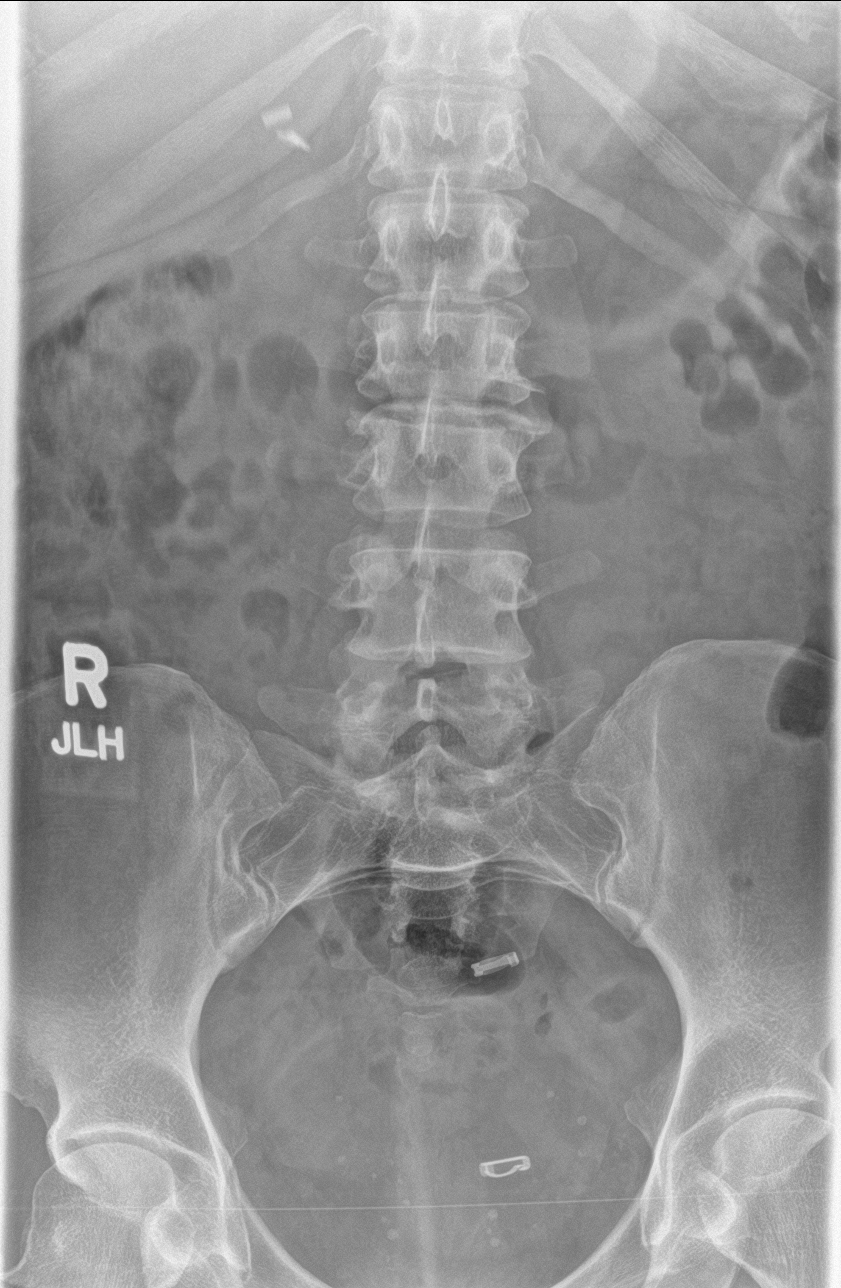

[l-spine obl (1 of 2)]
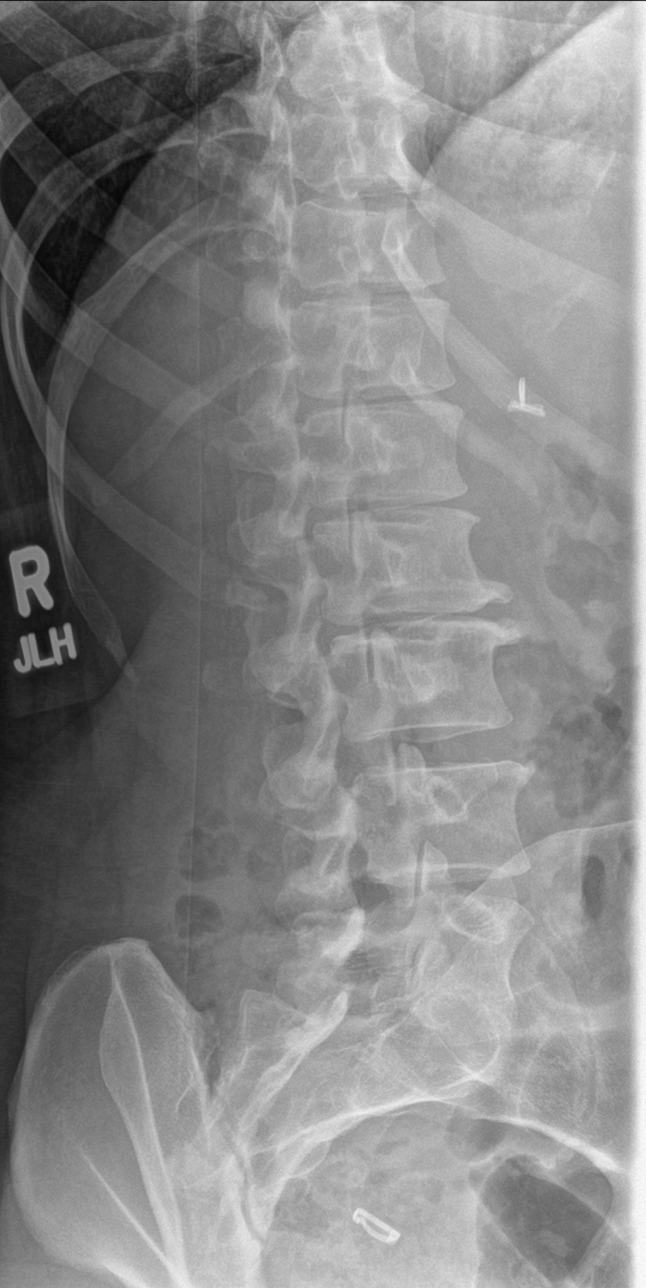

[l-spine obl (2 of 2)]
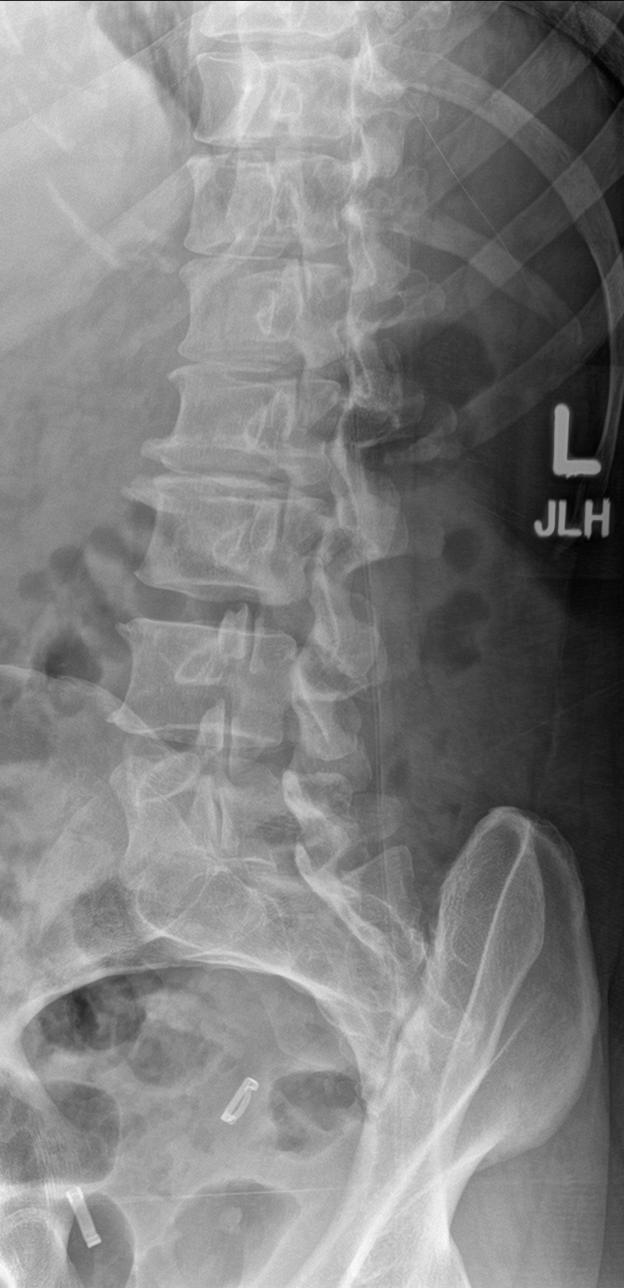

[l-spine lat]
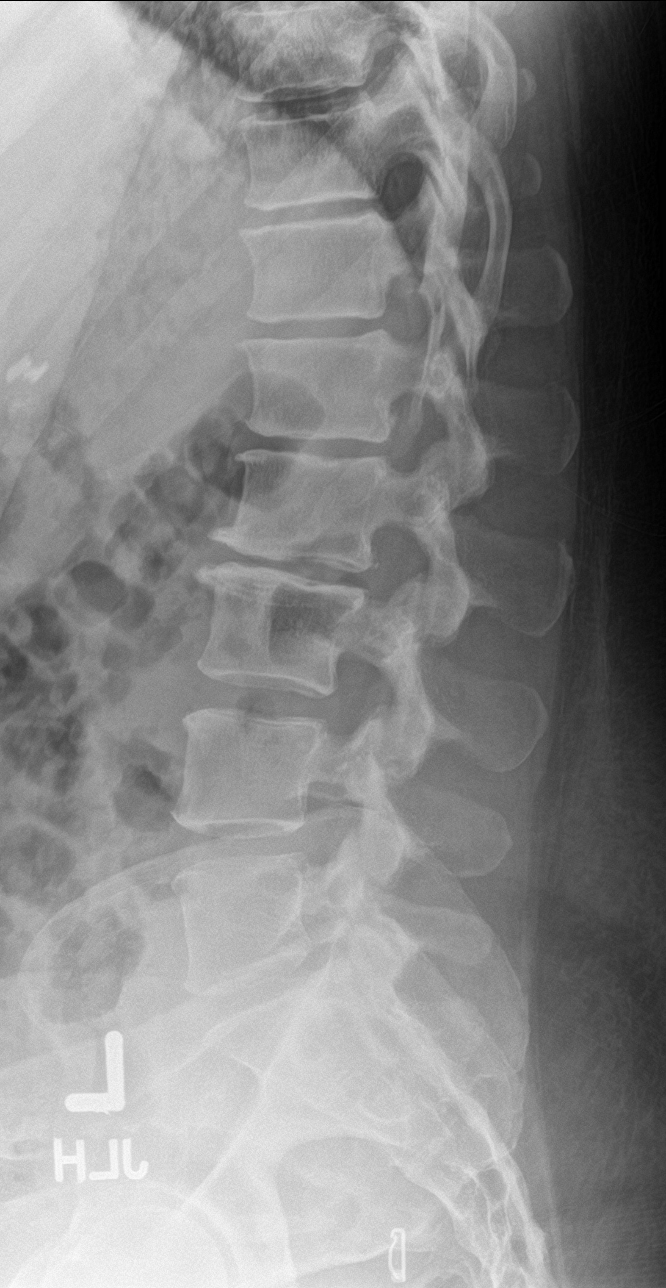

[l-spine spot]
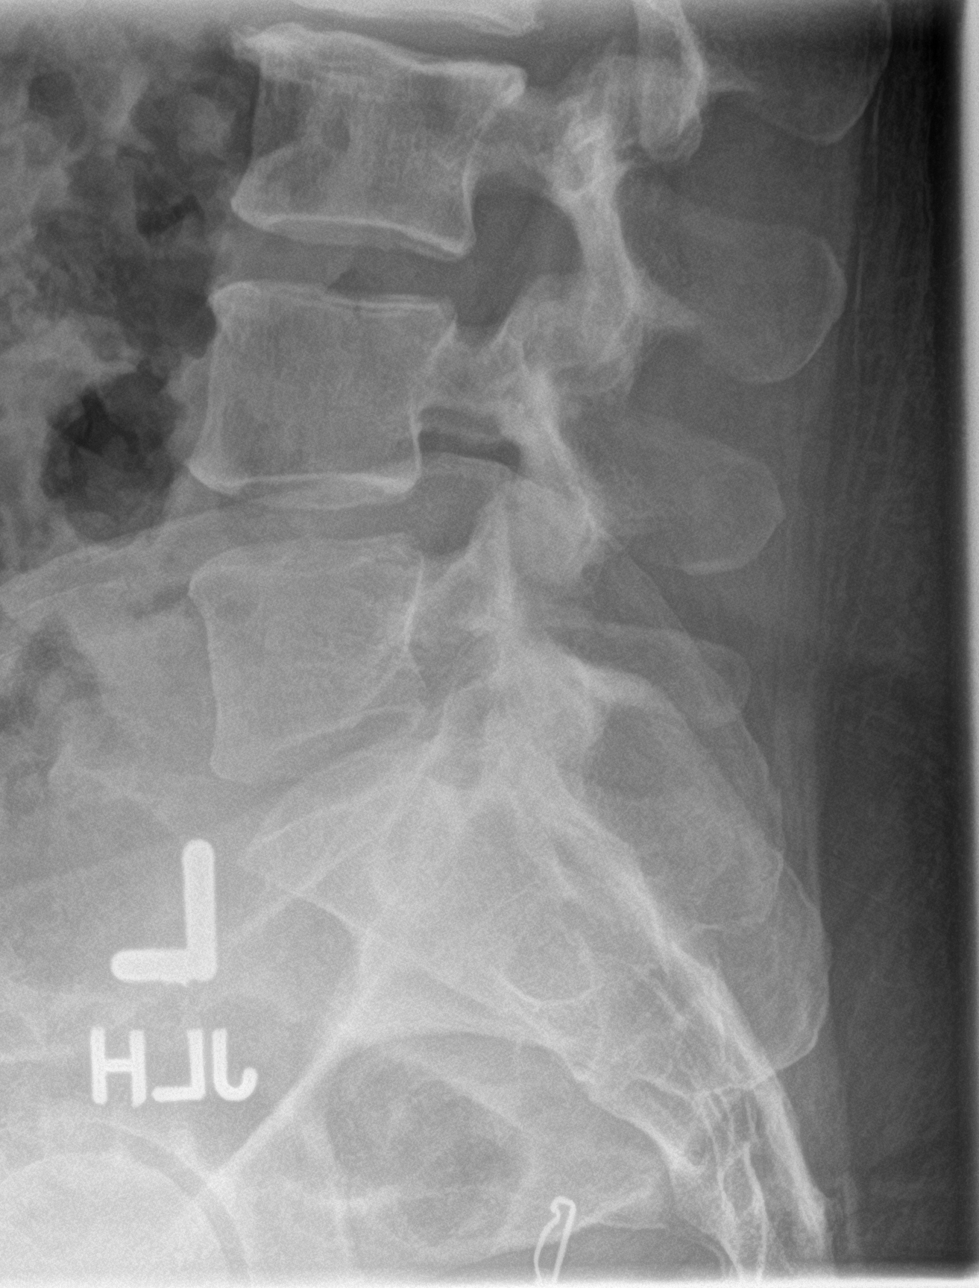

[5 of 5 positions shown; findings below may reference images not displayed]

FINDINGS: The alignment is maintained. Vertebral body heights are normal.
There is no listhesis. The posterior elements are intact. Disc space
narrowing and endplate spurring at L2-L3. Additional mild endplate
spurring with preservation of disc spaces at multiple levels. No
fracture. Sacroiliac joints are symmetric and normal.
IMPRESSION: Mild multilevel spondylosis, most significant at L2-L3 with
degenerative disc disease.

## 2022-06-25 ENCOUNTER — Encounter: Payer: Self-pay | Admitting: Physician Assistant

## 2022-06-25 ENCOUNTER — Ambulatory Visit: Payer: Medicare HMO | Admitting: Physician Assistant

## 2022-06-25 VITALS — BP 121/69 | HR 83 | Resp 18 | Ht 67.0 in | Wt 155.0 lb

## 2022-06-25 DIAGNOSIS — M79605 Pain in left leg: Secondary | ICD-10-CM | POA: Diagnosis not present

## 2022-06-25 DIAGNOSIS — F250 Schizoaffective disorder, bipolar type: Secondary | ICD-10-CM

## 2022-06-25 DIAGNOSIS — M79604 Pain in right leg: Secondary | ICD-10-CM | POA: Diagnosis not present

## 2022-06-25 DIAGNOSIS — K130 Diseases of lips: Secondary | ICD-10-CM | POA: Diagnosis not present

## 2022-06-25 DIAGNOSIS — M79671 Pain in right foot: Secondary | ICD-10-CM

## 2022-06-25 DIAGNOSIS — M79672 Pain in left foot: Secondary | ICD-10-CM

## 2022-06-25 NOTE — Progress Notes (Signed)
New Patient Office Visit  Subjective    Patient ID: Brittany Valdez, female    DOB: [birth year]  Age: 41 y.o. MRN: [id number]  CC:  Chief Complaint  Patient presents with   Leg Pain    Leg pain and foot pain    HPI [name] states that she has been having pain on both of her legs and feet for "many years"  States that she previously took gabapentin but stopped a couple of months ago.  States that she "needs something stronger" "Klonopin would be nice"  States that she has been having pain in her bottom lip for the past week, endorses trauma, but will not elaborate.    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  States that she does not trust hospitals or healthcare providers.   Outpatient Encounter Medications as of 06/25/2022  Medication Sig   gabapentin (NEURONTIN) 300 MG capsule Take 1 capsule (300 mg total) by mouth 3 (three) times daily. (Patient not taking: Reported on 06/25/2022)   [DISCONTINUED] benztropine (COGENTIN) 1 MG tablet Take 1 tablet (1 mg total) by mouth 2 (two) times daily.   [DISCONTINUED] divalproex (DEPAKOTE ER) 500 MG 24 hr tablet Take 1 tablet (500 mg total) by mouth at bedtime.   [DISCONTINUED] risperidone (RISPERDAL) 4 MG tablet Take 2 tablets (8 mg total) by mouth at bedtime. (Patient not taking: Reported on 06/25/2022)   [DISCONTINUED] traZODone (DESYREL) 150 MG tablet Take 1 tablet (150 mg total) by mouth at bedtime. (Patient not taking: Reported on 06/25/2022)   No facility-administered encounter medications on file as of 06/25/2022.    Past Medical History:  Diagnosis Date   Chronic neck pain     Past Surgical History:  Procedure Laterality Date   CERVICAL SPINE SURGERY     TUBAL LIGATION      History reviewed. No pertinent family history.  Social History   Socioeconomic History   Marital status: Divorced    Spouse name: Not on file   Number of children: Not on file    Years of education: Not on file   Highest education level: Not on file  Occupational History   Not on file  Tobacco Use   Smoking status: Every Day    Packs/day: 0.50    Types: Cigarettes   Smokeless tobacco: Never   Tobacco comments:    during assessment pt states 1 p/day use  Substance and Sexual Activity   Alcohol use: Yes    Alcohol/week: 2.0 standard drinks of alcohol    Types: 2 Cans of beer per week   Drug use: No   Sexual activity: Yes    Birth control/protection: Surgical  Other Topics Concern   Not on file  Social History Narrative   Not on file   Social Determinants of Health   Financial Resource Strain: Not on file  Food Insecurity: Not on file  Transportation Needs: Not on file  Physical Activity: Not on file  Stress: Not on file  Social Connections: Not on file  Intimate Partner Violence: Not on file    Review of Systems  Unable to perform ROS: Psychiatric disorder        Objective    BP 121/69 (BP Location: Right Arm, Patient Position: Sitting, Cuff Size: Normal)   Pulse 83   Resp 18   Ht 5\' 7"  (1.702 m)   Wt 155 lb (70.3 kg)  SpO2 98%   BMI 24.28 kg/m   Physical Exam Vitals and nursing note reviewed.  Constitutional:      Appearance: Normal appearance.  HENT:     Head: Normocephalic and atraumatic.     Right Ear: External ear normal.     Left Ear: External ear normal.     Nose: Nose normal.     Mouth/Throat:     Mouth: Mucous membranes are moist.     Pharynx: Oropharynx is clear.     Comments: Lips very dry, bottom lip cracked, does not appear to be from trauma Eyes:     Extraocular Movements: Extraocular movements intact.     Conjunctiva/sclera: Conjunctivae normal.     Pupils: Pupils are equal, round, and reactive to light.  Cardiovascular:     Rate and Rhythm: Normal rate and regular rhythm.  Pulmonary:     Comments: Unable to auscultate due to patient noncompliance Musculoskeletal:        General: Normal range of  motion.     Cervical back: Normal range of motion and neck supple.  Skin:    General: Skin is warm and dry.  Neurological:     Mental Status: She is alert.     Comments: Unable to determine due to patient psychiatric status  Psychiatric:        Mood and Affect: Affect is inappropriate.        Speech: Speech is tangential.        Thought Content: Thought content is delusional.       Assessment & Plan:   Problem List Items Addressed This Visit       Other   Schizoaffective disorder, bipolar type (HCC) - Primary   Other Visit Diagnoses     Chapped lips       Bilateral leg and foot pain          1. Schizoaffective disorder, bipolar type [facility]) Patient is nonsensical, patient adamantly declined any type of referral for primary care provider, psychiatry.  2. Chapped lips Patient given packets of Neosporin in clinic  3. Bilateral leg and foot pain Patient previously on gabapentin, advised trial of restarting gabapentin at low-dose and follow-up with a primary care provider, patient adamantly declines, states that she will follow-up with her scientist.   I have reviewed the patient's medical history (PMH, PSH, Social History, Family History, Medications, and allergies) , and have been updated if relevant. I spent 20 minutes reviewing chart and  face to face time with patient.    Return if symptoms worsen or fail to improve.   Kasandra Knudsen Mayers, PA-C

## 2022-06-25 NOTE — Patient Instructions (Signed)
You can use the neosporin on the cut on your lip until it heals.  Please let us know if there is anything else we can do for you.  Roney Jaffe, PA-C Physician Assistant [facility] [email or web address]

## 2023-03-25 ENCOUNTER — Ambulatory Visit: Payer: Medicaid Other | Admitting: Physician Assistant

## 2023-03-25 ENCOUNTER — Encounter: Payer: Self-pay | Admitting: Physician Assistant

## 2023-03-25 VITALS — BP 133/82 | HR 100 | Ht 66.0 in | Wt 165.0 lb

## 2023-03-25 DIAGNOSIS — K130 Diseases of lips: Secondary | ICD-10-CM | POA: Diagnosis not present

## 2023-03-25 DIAGNOSIS — K029 Dental caries, unspecified: Secondary | ICD-10-CM | POA: Diagnosis not present

## 2023-03-25 DIAGNOSIS — F250 Schizoaffective disorder, bipolar type: Secondary | ICD-10-CM | POA: Diagnosis not present

## 2023-03-25 DIAGNOSIS — Z5900 Homelessness unspecified: Secondary | ICD-10-CM | POA: Insufficient documentation

## 2023-03-25 MED ORDER — QUETIAPINE FUMARATE 50 MG PO TABS
50.0000 mg | ORAL_TABLET | Freq: Every day | ORAL | 1 refills | Status: AC
Start: 2023-03-25 — End: ?

## 2023-03-25 MED ORDER — GABAPENTIN 100 MG PO CAPS
100.0000 mg | ORAL_CAPSULE | Freq: Three times a day (TID) | ORAL | 1 refills | Status: AC
Start: 2023-03-25 — End: ?

## 2023-03-25 NOTE — Progress Notes (Signed)
Established Patient Office Visit  Subjective   Patient ID: Brittany Valdez, female    DOB: 07-Jul-1981  Age: 42 y.o. MRN: [id number]  Chief Complaint  Patient presents with   Dental Pain   Back Pain   Headache         States that she has suffered from chronic back pain, headaches.  States that she has been using Tylenol and ibuprofen without relief.  Does endorse previously using gabapentin with relief.  States that she is not taking the Risperdal, states that she does not want that medication since she is "not psychotic"  Denies dental pain, but states that she does need a dentist, does not have one currently.  Patient is very nonsensical, speaks of living in the [place], being part of [place] experiments, having her heart and a lung removed.    Past Medical History:  Diagnosis Date   Chronic neck pain    Social History   Socioeconomic History   Marital status: Divorced    Spouse name: Not on file   Number of children: Not on file   Years of education: Not on file   Highest education level: Not on file  Occupational History   Not on file  Tobacco Use   Smoking status: Every Day    Packs/day: .5    Types: Cigarettes   Smokeless tobacco: Never   Tobacco comments:    during assessment pt states 1 p/day use  Substance and Sexual Activity   Alcohol use: Yes    Alcohol/week: 2.0 standard drinks of alcohol    Types: 2 Cans of beer per week   Drug use: No   Sexual activity: Yes    Birth control/protection: Surgical  Other Topics Concern   Not on file  Social History Narrative   Not on file   Social Determinants of Health   Financial Resource Strain: Not on file  Food Insecurity: Not on file  Transportation Needs: Not on file  Physical Activity: Not on file  Stress: Not on file  Social Connections: Not on file  Intimate Partner Violence: Not on file   History reviewed. No pertinent family history. No Known Allergies  Review of Systems  Constitutional:  Negative.   HENT: Negative.    Eyes: Negative.   Respiratory:  Negative for shortness of breath.   Cardiovascular:  Negative for chest pain.  Gastrointestinal: Negative.   Genitourinary: Negative.   Musculoskeletal: Negative.   Skin: Negative.   Neurological: Negative.   Endo/Heme/Allergies: Negative.       Objective:     BP 133/82 (BP Location: Left Arm, Patient Position: Sitting, Cuff Size: Large)   Pulse 100   Ht 5\' 6"  (1.676 m)   Wt 165 lb (74.8 kg)   LMP 03/15/2023   SpO2 93%   BMI 26.63 kg/m  BP Readings from Last 3 Encounters:  03/25/23 133/82  06/25/22 121/69  02/10/20 (!) 103/59   Wt Readings from Last 3 Encounters:  03/25/23 165 lb (74.8 kg)  06/25/22 155 lb (70.3 kg)  01/07/16 211 lb (95.7 kg)      Physical Exam Vitals and nursing note reviewed.  Constitutional:      Appearance: Normal appearance.  HENT:     Head: Normocephalic and atraumatic.     Right Ear: External ear normal.     Left Ear: External ear normal.     Nose: Nose normal.     Mouth/Throat:     Mouth: Mucous membranes are moist.  Dentition: Abnormal dentition. Dental caries present.     Comments: Lips very dry and cracked Eyes:     Extraocular Movements: Extraocular movements intact.     Conjunctiva/sclera: Conjunctivae normal.     Pupils: Pupils are equal, round, and reactive to light.  Cardiovascular:     Rate and Rhythm: Normal rate and regular rhythm.     Pulses: Normal pulses.     Heart sounds: Normal heart sounds.  Pulmonary:     Comments: Unable to auscultate, patient uncooperative Musculoskeletal:     Cervical back: Normal range of motion and neck supple.  Neurological:     Mental Status: She is alert.  Psychiatric:        Mood and Affect: Affect is inappropriate.        Speech: Speech is tangential.        Behavior: Behavior is not aggressive or combative.        Thought Content: Thought content is delusional.        Judgment: Judgment is inappropriate.         Assessment & Plan:   Problem List Items Addressed This Visit       Other   Schizoaffective disorder, bipolar type - Primary   Relevant Medications   gabapentin (NEURONTIN) 100 MG capsule   QUEtiapine (SEROQUEL) 50 MG tablet   Other Visit Diagnoses     Dental caries       Relevant Orders   Ambulatory referral to Dentistry   Chapped lips          1. Schizoaffective disorder, bipolar type Patient previously prescribed gabapentin and Risperdal by behavioral health, agreeable to restart gabapentin, trial Seroquel.  Patient does not want to start any medication for her chronic pain or headaches, states that she feels gabapentin will offer her relief.  Patient encouraged to return to mobile unit in 1 month - gabapentin (NEURONTIN) 100 MG capsule; Take 1 capsule (100 mg total) by mouth 3 (three) times daily.  Dispense: 90 capsule; Refill: 1 - QUEtiapine (SEROQUEL) 50 MG tablet; Take 1 tablet (50 mg total) by mouth at bedtime.  Dispense: 30 tablet; Refill: 1  2. Dental caries  - Ambulatory referral to Dentistry  3. Chapped lips Patient given packets of Neosporin   I have reviewed the patient's medical history (PMH, PSH, Social History, Family History, Medications, and allergies) , and have been updated if relevant. I spent 30 minutes reviewing chart and  face to face time with patient.    4. Homelessness  Return in about 1 month (around 04/24/2023) for with [facility].    Loraine Grip Mayers, PA-C

## 2023-03-25 NOTE — Patient Instructions (Signed)
To help with your pain, you are going to restart gabapentin, 100 mg 3 times a day.   To help with your moods, you are going to start taking Seroquel 50 mg at bedtime.  Is important that you take this on a daily basis.  I started a referral for you to be seen by dentist, they will call to schedule an appointment  Please return to the mobile unit in 1 month for follow-up  Kennieth Rad, PA-C Physician Assistant [facility] [email or web address]

## 2023-05-25 LAB — AMB RESULTS CONSOLE CBG: Glucose: 93

## 2024-11-11 LAB — GLUCOSE, POCT (MANUAL RESULT ENTRY): Glucose Fasting, POC: 93 mg/dL (ref 70–99)

## 2024-11-13 NOTE — Progress Notes (Signed)
 Pt states BP is probably elevated from her use of Meth the previous night.

## 2024-12-05 NOTE — Progress Notes (Unsigned)
 The patient attended a screening event on 11/13/2024, where her blood pressure was measured at 137/97 mmHg, and her glucose was 93 mg/dl. During the event, the patient reported that she does smoke, has insurance, is not established with a primary care provider, and declined SDOH screener.   A chart review indicates that she does not have a PCP. She has a history of ED visits on the last 12 months. Chart review further indicates [employer] Worker's Comp and Medicaid as her insurance. She is a smoker and does not have any other SDOH needs indicated at this time.   Call Attempt #1: CHW called pt to obtain PCP status and smoking cessation. CHW was not able to reach pt nor leave a vm at this time.  Call Attempt #2: CHW called pt again to discuss PCP status and smoking cessation. CHW was unable to contact pt again.  Call Attempt #3: CHW called pt for third attempt. CHW was not able to contact pt and leave vm.  At this time, no additional support from the [facility] Team is indicated./An additional follow up will be done at a later date per the [facility] Teams protocol.
